# Patient Record
Sex: Female | Born: 1955 | Race: White | Hispanic: No | Marital: Single | Smoking: Former smoker
Health system: Southern US, Community
[De-identification: ages and names within clinical notes are randomized; demographics above are authoritative.]

## PROBLEM LIST (undated history)

## (undated) DIAGNOSIS — R7989 Other specified abnormal findings of blood chemistry: Secondary | ICD-10-CM

## (undated) DIAGNOSIS — E785 Hyperlipidemia, unspecified: Secondary | ICD-10-CM

## (undated) DIAGNOSIS — K8020 Calculus of gallbladder without cholecystitis without obstruction: Secondary | ICD-10-CM

## (undated) DIAGNOSIS — F411 Generalized anxiety disorder: Secondary | ICD-10-CM

## (undated) DIAGNOSIS — M21621 Bunionette of right foot: Secondary | ICD-10-CM

## (undated) DIAGNOSIS — J449 Chronic obstructive pulmonary disease, unspecified: Secondary | ICD-10-CM

## (undated) DIAGNOSIS — R9389 Abnormal findings on diagnostic imaging of other specified body structures: Secondary | ICD-10-CM

## (undated) HISTORY — PX: SINUS SURGERY WITH INSTATRAK: SHX5215

## (undated) HISTORY — DX: Other specified abnormal findings of blood chemistry: R79.89

## (undated) HISTORY — DX: Abnormal findings on diagnostic imaging of other specified body structures: R93.89

## (undated) HISTORY — DX: Calculus of gallbladder without cholecystitis without obstruction: K80.20

## (undated) HISTORY — DX: Hyperlipidemia, unspecified: E78.5

## (undated) HISTORY — PX: TONSILLECTOMY: SUR1361

## (undated) HISTORY — PX: ABDOMINAL HYSTERECTOMY: SHX81

## (undated) HISTORY — DX: Bunionette of right foot: M21.621

## (undated) HISTORY — DX: Generalized anxiety disorder: F41.1

---

## 2006-10-01 ENCOUNTER — Emergency Department (HOSPITAL_COMMUNITY): Admission: EM | Admit: 2006-10-01 | Discharge: 2006-10-01 | Payer: Self-pay | Admitting: Emergency Medicine

## 2017-02-01 ENCOUNTER — Emergency Department (HOSPITAL_BASED_OUTPATIENT_CLINIC_OR_DEPARTMENT_OTHER)
Admission: EM | Admit: 2017-02-01 | Discharge: 2017-02-01 | Disposition: A | Discharge status: Home / Self Care | Payer: BLUE CROSS/BLUE SHIELD | Attending: Emergency Medicine | Admitting: Emergency Medicine

## 2017-02-01 ENCOUNTER — Emergency Department (HOSPITAL_BASED_OUTPATIENT_CLINIC_OR_DEPARTMENT_OTHER): Payer: BLUE CROSS/BLUE SHIELD

## 2017-02-01 ENCOUNTER — Encounter (HOSPITAL_BASED_OUTPATIENT_CLINIC_OR_DEPARTMENT_OTHER): Payer: Self-pay | Admitting: *Deleted

## 2017-02-01 DIAGNOSIS — Z79899 Other long term (current) drug therapy: Secondary | ICD-10-CM | POA: Diagnosis not present

## 2017-02-01 DIAGNOSIS — F17210 Nicotine dependence, cigarettes, uncomplicated: Secondary | ICD-10-CM | POA: Diagnosis not present

## 2017-02-01 DIAGNOSIS — J449 Chronic obstructive pulmonary disease, unspecified: Secondary | ICD-10-CM | POA: Insufficient documentation

## 2017-02-01 DIAGNOSIS — M7989 Other specified soft tissue disorders: Secondary | ICD-10-CM | POA: Diagnosis present

## 2017-02-01 DIAGNOSIS — Z7982 Long term (current) use of aspirin: Secondary | ICD-10-CM | POA: Insufficient documentation

## 2017-02-01 DIAGNOSIS — L03115 Cellulitis of right lower limb: Secondary | ICD-10-CM | POA: Insufficient documentation

## 2017-02-01 HISTORY — DX: Chronic obstructive pulmonary disease, unspecified: J44.9

## 2017-02-01 MED ORDER — CEPHALEXIN 500 MG PO CAPS: 500.0000 mg | ORAL_CAPSULE | Freq: Four times a day (QID) | ORAL | 0 refills | Status: DC

## 2017-02-01 NOTE — ED Notes (Signed)
Patient transported to X-ray 

## 2017-02-01 NOTE — ED Triage Notes (Signed)
Patient states she was at the beach for the last week and four days ago she was playing with her granddaughter who accidentally jumped onto her right foot.  Now has redness, pain and swelling on the lateral right foot.  States the swelling yesterday was worse, and is better today.  Also is itchy.

## 2017-02-01 NOTE — ED Provider Notes (Signed)
[facility] Provider Note   CSN: [id number] Arrival date & time: 02/01/17  0930  By signing my name below, I, Sandra Anthony, attest that this documentation has been prepared under the direction and in the presence of [name], MD . Electronically Signed: Freida Anthony, Scribe. 02/01/2017. 10:28 AM. History   Chief Complaint Chief Complaint  Patient presents with  . Foot Pain    right    The history is provided by the patient. No language interpreter was used.    HPI Comments:  Sandra Anthony is a 61 y.o. female who presents to the Emergency Department complaining of moderate pain to the right foot x 5 days. She states her granddaughter accidentally jumped on her foot. She reports associated redness and swelling to the site. Pt finished 10 day course of antibiotics 7 days ago for an infection following corn removal to the right foot ~ 2 weeks ago.  Pt was re-evaluated by her PCP this AM and was sent to the ED for imagining of the foot. Pt denies CP, and SOB. No alleviating factors noted.    Past Medical History:  Diagnosis Date  . COPD (chronic obstructive pulmonary disease) (HCC)     There are no active problems to display for this patient.   Past Surgical History:  Procedure Laterality Date  . ABDOMINAL HYSTERECTOMY    . TONSILLECTOMY      OB History    No data available       Home Medications    Prior to Admission medications   Medication Sig Start Date End Date Taking? Authorizing Provider  albuterol (PROVENTIL HFA;VENTOLIN HFA) 108 (90 Base) MCG/ACT inhaler Inhale into the lungs every 6 (six) hours as needed for wheezing or shortness of breath.   Yes Historical Provider, MD  Ascorbic Acid (VITAMIN C) 100 MG tablet Take 100 mg by mouth daily.   Yes Historical Provider, MD  aspirin EC 81 MG tablet Take 81 mg by mouth daily.   Yes Historical Provider, MD  B Complex-C (B-COMPLEX WITH VITAMIN C) tablet Take 1 tablet by mouth daily.   Yes Historical  Provider, MD  budesonide-formoterol (SYMBICORT) 80-4.5 MCG/ACT inhaler Inhale 2 puffs into the lungs 2 (two) times daily.   Yes Historical Provider, MD  Multiple Vitamin (MULTIVITAMIN) capsule Take 1 capsule by mouth daily.   Yes Historical Provider, MD    Family History No family history on file.  Social History Social History  Substance Use Topics  . Smoking status: Current Every Day Smoker    Packs/day: 1.00    Types: Cigarettes  . Smokeless tobacco: Never Used  . Alcohol use No     Allergies   Other and Codeine   Review of Systems Review of Systems  Constitutional: Negative for chills and fever.  Respiratory: Negative for shortness of breath.   Cardiovascular: Negative for chest pain.  Musculoskeletal: Positive for arthralgias and myalgias.  Skin: Positive for color change.     Physical Exam Updated Vital Signs BP (!) 143/84 (BP Location: Right Arm)   Pulse 88   Temp 98 F (36.7 C) (Oral)   Resp 16   Ht  (1.549 m)   Wt 119 lb (54 kg)   SpO2 99%   BMI 22.48 kg/m   Physical Exam  Constitutional: She is oriented to person, place, and time. She appears well-developed and well-nourished. No distress.  HENT:  Head: Normocephalic and atraumatic.  Eyes: Conjunctivae are normal.  Cardiovascular: Normal rate.  Pulmonary/Chest: Effort normal.  Abdominal: She exhibits no distension.  Musculoskeletal: She exhibits edema.  Mild swelling of right foot and ankle  No calf tenderness NVI   Neurological: She is alert and oriented to person, place, and time.  Skin: Skin is warm and dry. There is erythema.  Erythema and petechial rash to the lateral right foot and ankle which extends into the distal shin   Psychiatric: She has a normal mood and affect.  Nursing note and vitals reviewed.    ED Treatments / Results  DIAGNOSTIC STUDIES:  Oxygen Saturation is 99% on RA, normal by my interpretation.    COORDINATION OF CARE:  10:26 AM Discussed treatment plan  with pt at bedside and pt agreed to plan.  Labs (all labs ordered are listed, but only abnormal results are displayed) Labs Reviewed - No data to display  EKG  EKG Interpretation None       Radiology Dg Ankle Complete Right  Result Date: 02/01/2017 CLINICAL DATA:  Right lateral ankle pain with redness. EXAM: RIGHT ANKLE - COMPLETE 3+ VIEW COMPARISON:  None. FINDINGS: There is no evidence of fracture, dislocation, or joint effusion. Ossicle of the medial malleolus. No degenerative joint narrowing. IMPRESSION: Negative. Electronically Signed   By: Marnee Spring M.D.   On: 02/01/2017 10:29    Procedures Procedures (including critical care time)  Medications Ordered in ED Medications - No data to display  11:08 AM Pt updated with results.   Initial Impression / Assessment and Plan / ED Course  I have reviewed the triage vital signs and the nursing notes.  Pertinent labs & imaging results that were available during my care of the patient were reviewed by me and considered in my medical decision making (see chart for details).     60yF with R foot pain. She is point tender where she was recently struck by her granddaughter. Imaging negative. Likely contusion.   She has some swelling and a faint petechial rash extending up from this. She is complaining of itching. Could be some type of dermatitis? Vasculitis? Cellulitis is consideration. Did recently have corn removed, but this site appears to be healing swell and skin changes are well proximal.  I think DVT is significantly less likely. Did drive to beach, but [facility] is only a few hours. No calf tenderness. Denies dyspnea or CP. No hx of DVT. Not hypoxic. HR normal.   Final Clinical Impressions(s) / ED Diagnoses   Final diagnoses:  Cellulitis of right lower leg    New Prescriptions New Prescriptions   No medications on file   I personally preformed the services scribed in my presence. The recorded information has been  reviewed is accurate. [name], MD.     [name], MD 02/09/17 [phone]

## 2018-08-28 IMAGING — US US EXTREM LOW VENOUS*R*
1 series · 13 of 24 positions shown · non-contrast
Comparison: None.

CLINICAL DATA: Right foot pain radiating to calf, with
redness/streaking



[Series 1: us extrem low venous*right* · 0.05mm/px · 13 of 35 slices shown]
[im 1/35]
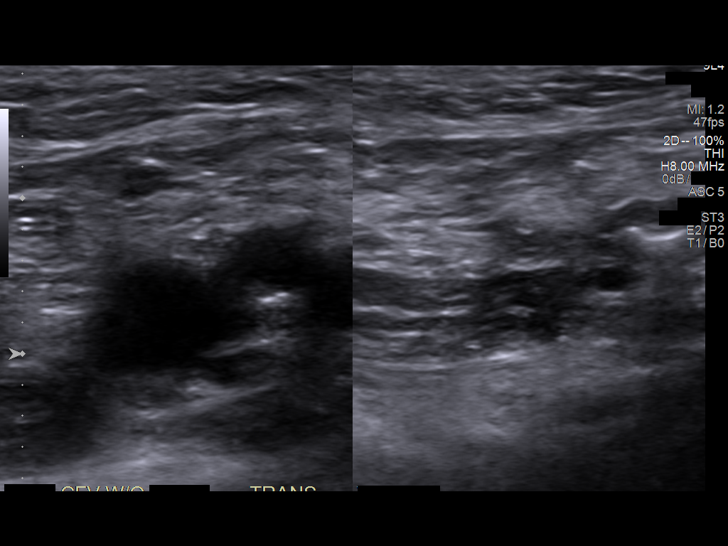
[im 3/35]
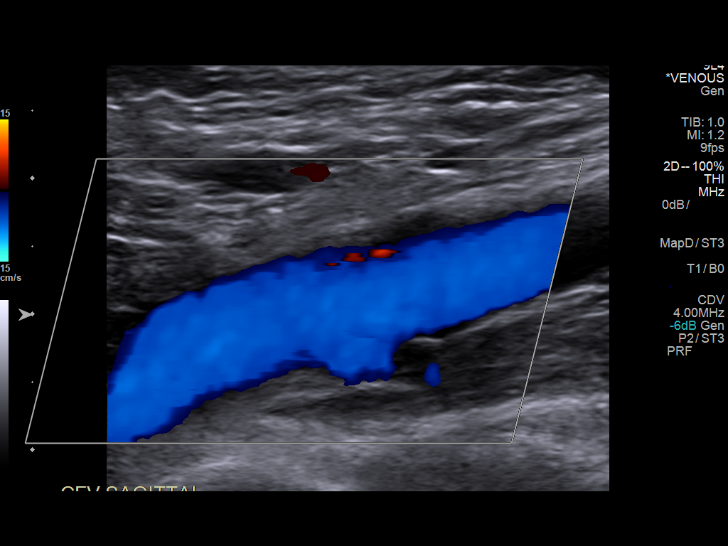
[im 6/35]
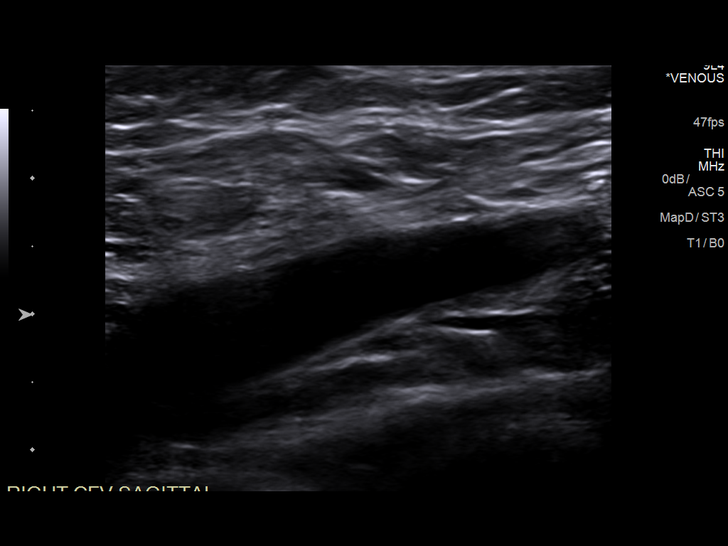
[im 9/35]
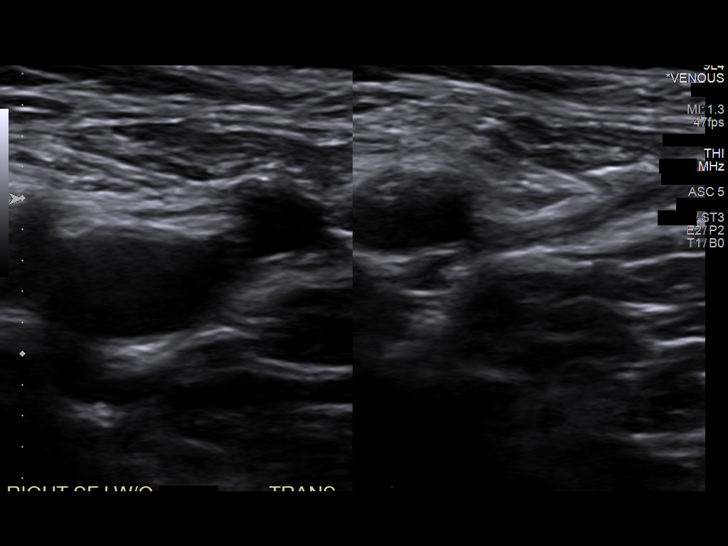
[im 12/35]
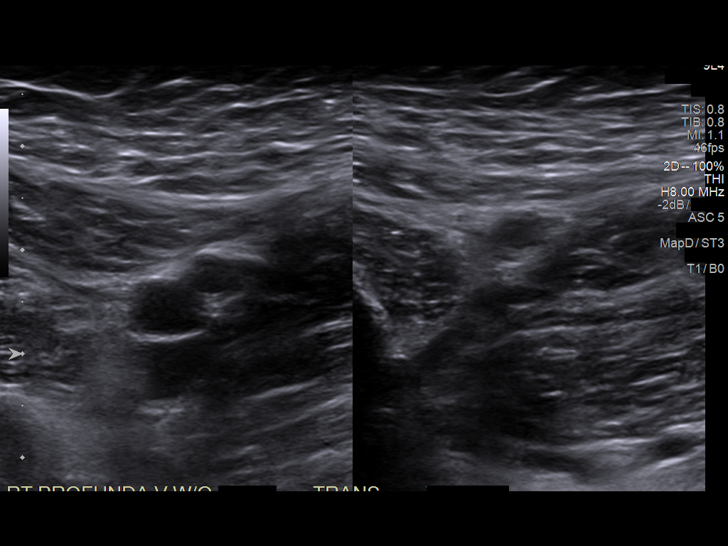
[im 15/35]
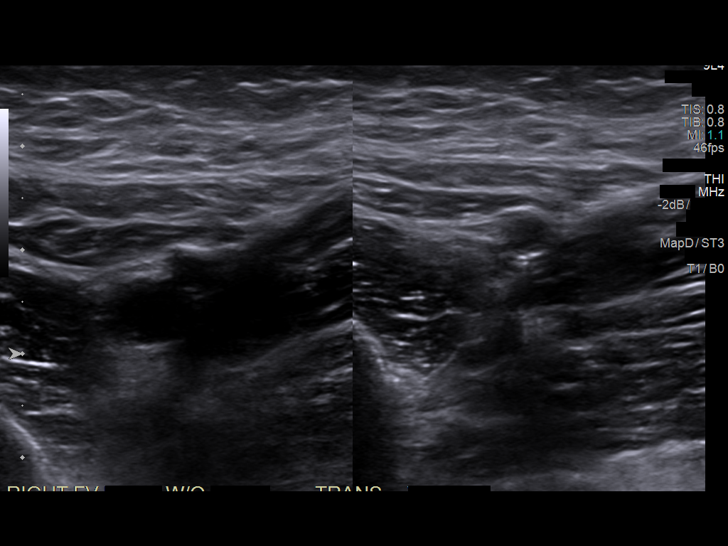
[im 18/35]
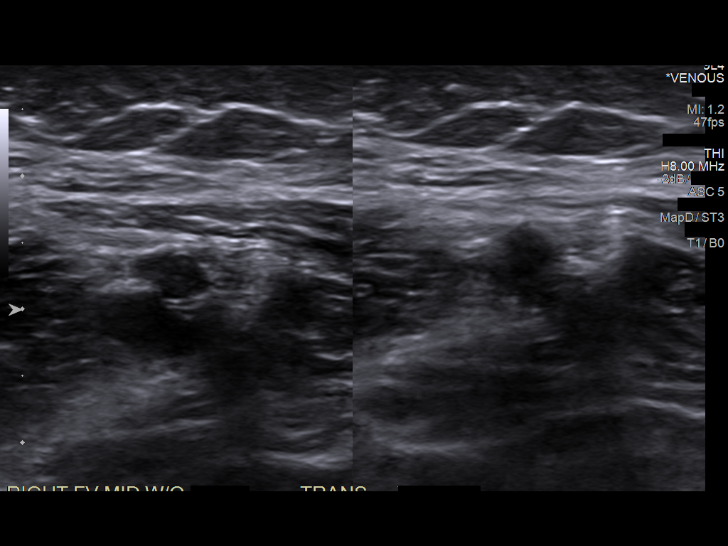
[im 20/35]
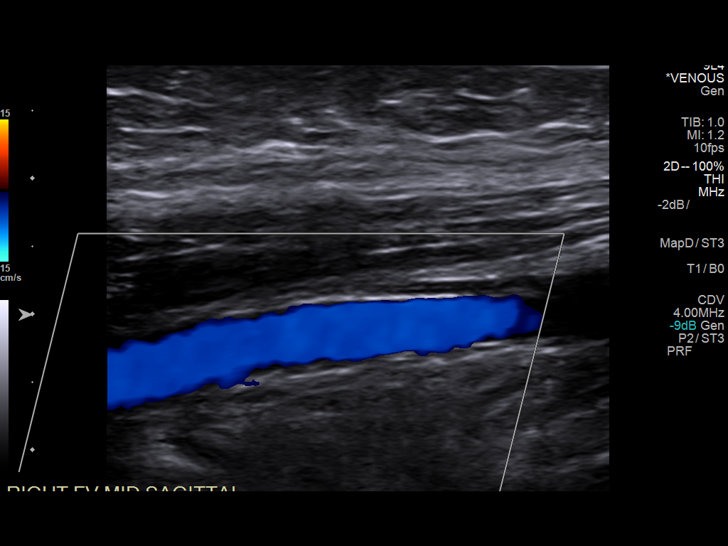
[im 23/35]
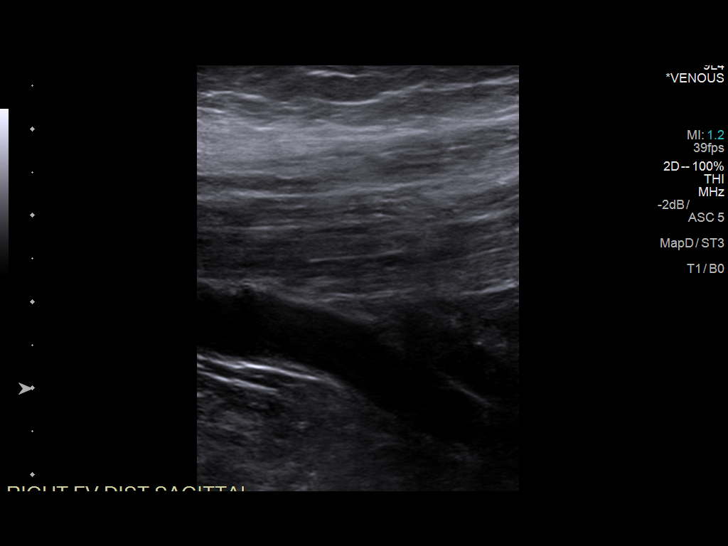
[im 26/35]
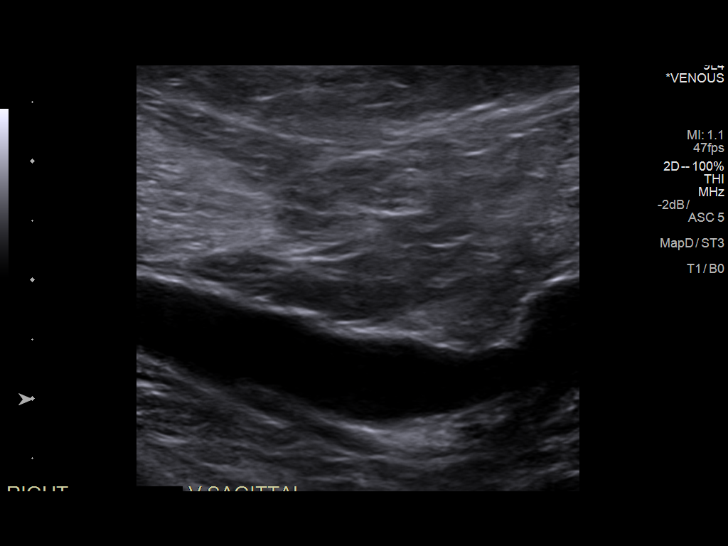
[im 29/35]
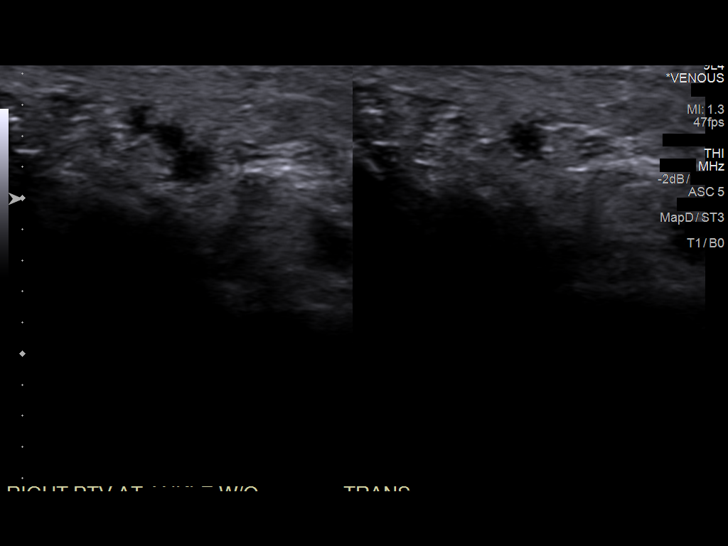
[im 32/35]
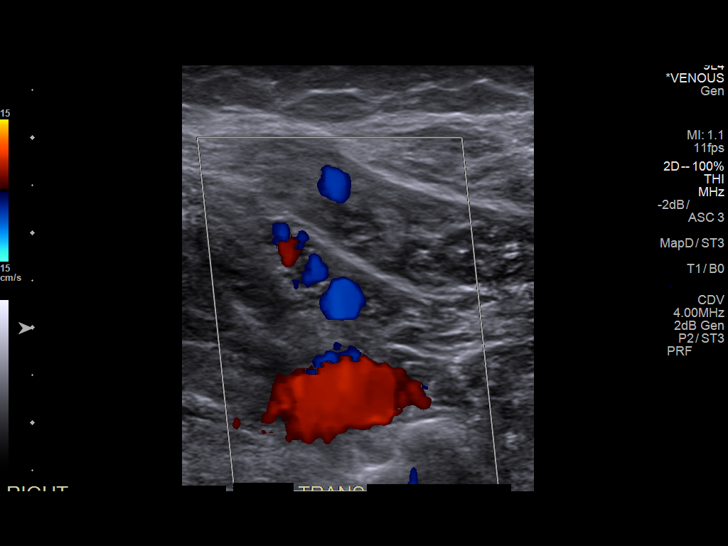
[im 35/35]
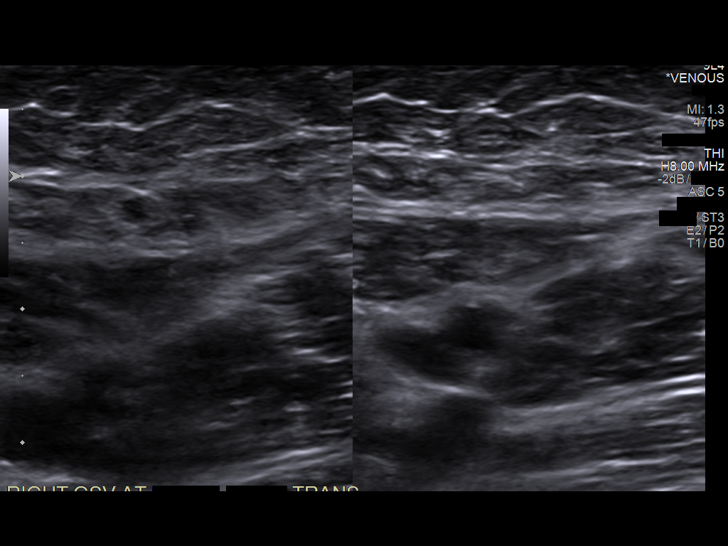

[13 of 24 positions shown; findings below may reference images not displayed]

FINDINGS: Contralateral Common Femoral Vein: Respiratory phasicity is normal
and symmetric with the symptomatic side. No evidence of thrombus.
Normal compressibility.

Common Femoral Vein: No evidence of thrombus. Normal
compressibility, respiratory phasicity and response to augmentation.

Saphenofemoral Junction: No evidence of thrombus. Normal
compressibility and flow on color Doppler imaging.

Profunda Femoral Vein: No evidence of thrombus. Normal
compressibility and flow on color Doppler imaging.

Femoral Vein: No evidence of thrombus. Normal compressibility,
respiratory phasicity and response to augmentation.

Popliteal Vein: No evidence of thrombus. Normal compressibility,
respiratory phasicity and response to augmentation.

Calf Veins: No evidence of thrombus. Normal compressibility and flow
on color Doppler imaging.

Superficial Great Saphenous Vein: No evidence of thrombus. Normal
compressibility and flow on color Doppler imaging.

Venous Reflux:  None.

Other Findings:  None.
IMPRESSION: No evidence of deep venous thrombosis.

## 2019-03-07 ENCOUNTER — Ambulatory Visit: Payer: Self-pay | Admitting: Cardiology

## 2019-03-08 ENCOUNTER — Ambulatory Visit: Payer: Self-pay | Admitting: Cardiology

## 2019-03-11 ENCOUNTER — Ambulatory Visit: Payer: Self-pay | Admitting: Cardiology

## 2019-03-11 DIAGNOSIS — K8020 Calculus of gallbladder without cholecystitis without obstruction: Secondary | ICD-10-CM

## 2019-03-11 DIAGNOSIS — R7989 Other specified abnormal findings of blood chemistry: Secondary | ICD-10-CM

## 2019-03-11 DIAGNOSIS — M21621 Bunionette of right foot: Secondary | ICD-10-CM

## 2019-03-11 DIAGNOSIS — R9389 Abnormal findings on diagnostic imaging of other specified body structures: Secondary | ICD-10-CM

## 2019-03-11 DIAGNOSIS — F411 Generalized anxiety disorder: Secondary | ICD-10-CM

## 2019-03-14 ENCOUNTER — Other Ambulatory Visit: Payer: Self-pay

## 2019-03-14 ENCOUNTER — Encounter: Payer: Self-pay | Admitting: Cardiology

## 2019-03-14 ENCOUNTER — Ambulatory Visit: Payer: Medicare HMO | Admitting: Cardiology

## 2019-03-14 VITALS — BP 155/69 | HR 72 | Ht 61.0 in | Wt 128.7 lb

## 2019-03-14 DIAGNOSIS — R030 Elevated blood-pressure reading, without diagnosis of hypertension: Secondary | ICD-10-CM

## 2019-03-14 DIAGNOSIS — Z87891 Personal history of nicotine dependence: Secondary | ICD-10-CM

## 2019-03-14 DIAGNOSIS — R0989 Other specified symptoms and signs involving the circulatory and respiratory systems: Secondary | ICD-10-CM

## 2019-03-14 DIAGNOSIS — R011 Cardiac murmur, unspecified: Secondary | ICD-10-CM

## 2019-03-14 NOTE — Progress Notes (Signed)
Primary Physician:  Vicenta Aly, FNP   Patient ID: [name], female    DOB: 1956/08/16, 63 y.o.   MRN: [id number]  Subjective:    Chief Complaint  Patient presents with  . Heart Murmur  . New Patient (Initial Visit)    HPI: Sandra Anthony  is a 63 y.o. female  with former tobacco use, COPD, referred to [place] by Vicenta Aly, NP for evaluation of newly noted heart murmur.  Patient was recently seen for routine follow up and newly noted heart murmur was noted. She is asymptomatic in regards to this. She denies any history of hypertension or hyperlipidemia. States TSH has been elevated in the past, and has unyielding workup by endocrinology. She denies any chest pain, shortness of breath, palpitations, leg swelling, syncope, or symptoms suggestive of TIA. She does have some cramping in bilateral legs at the end of the day after working all day. She denies any cramping or fatigue with walking. Notices that her symptoms improve if she drinks some gatorade during the day.   She works as a Educational psychologist and states that she often walks approximately 10 miles per day with her job. She is currently is out of work due to [employer].   Reports that she quit smoking in Nov 2018. She denies any drug or alcohol use.   Past Medical History:  Diagnosis Date  . Abnormal chest x-ray   . Abnormal TSH   . COPD (chronic obstructive pulmonary disease) ([place])   . GAD (generalized anxiety disorder)   . Gallstones   . Hyperlipidemia   . Tailor's bunion of right foot     Past Surgical History:  Procedure Laterality Date  . ABDOMINAL HYSTERECTOMY    . SINUS SURGERY WITH INSTATRAK    . TONSILLECTOMY      Social History   Socioeconomic History  . Marital status: Single    Spouse name: Not on file  . Number of children: 3  . Years of education: Not on file  . Highest education level: Not on file  Occupational History  . Not on file  Social Needs  . Financial resource strain: Not on file  .  Food insecurity:    Worry: Not on file    Inability: Not on file  . Transportation needs:    Medical: Not on file    Non-medical: Not on file  Tobacco Use  . Smoking status: Former Smoker    Packs/day: 1.00    Types: Cigarettes    Last attempt to quit: 08/27/2017    Years since quitting: 1.5  . Smokeless tobacco: Never Used  Substance and Sexual Activity  . Alcohol use: No  . Drug use: Never  . Sexual activity: Not on file  Lifestyle  . Physical activity:    Days per week: Not on file    Minutes per session: Not on file  . Stress: Not on file  Relationships  . Social connections:    Talks on phone: Not on file    Gets together: Not on file    Attends religious service: Not on file    Active member of club or organization: Not on file    Attends meetings of clubs or organizations: Not on file    Relationship status: Not on file  . Intimate partner violence:    Fear of current or ex partner: Not on file    Emotionally abused: Not on file    Physically abused: Not on file  Forced sexual activity: Not on file  Other Topics Concern  . Not on file  Social History Narrative  . Not on file    Review of Systems  Constitution: Negative for decreased appetite, malaise/fatigue, weight gain and weight loss.  Eyes: Negative for visual disturbance.  Cardiovascular: Negative for chest pain, claudication, dyspnea on exertion, leg swelling, orthopnea, palpitations and syncope.  Respiratory: Negative for hemoptysis and wheezing.   Endocrine: Negative for cold intolerance and heat intolerance.  Hematologic/Lymphatic: Does not bruise/bleed easily.  Skin: Negative for nail changes.  Musculoskeletal: Negative for muscle weakness and myalgias.  Gastrointestinal: Negative for abdominal pain, change in bowel habit, nausea and vomiting.  Neurological: Negative for difficulty with concentration, dizziness, focal weakness and headaches.  Psychiatric/Behavioral: Negative for altered mental  status and suicidal ideas.  All other systems reviewed and are negative.     Objective:  Blood pressure (!) 155/69, pulse 72, height 5' 1"  (1.549 m), weight 128 lb 11.2 oz (58.4 kg), SpO2 99 %. Body mass index is 24.32 kg/m.    Physical Exam  Constitutional: She is oriented to person, place, and time. Vital signs are normal. She appears well-developed and well-nourished.  HENT:  Head: Normocephalic and atraumatic.  Neck: Normal range of motion.  Cardiovascular: Normal rate, regular rhythm and intact distal pulses.  Murmur heard.  Early systolic murmur is present with a grade of 2/6 at the upper right sternal border radiating to the neck and axilla. Pulses:      Carotid pulses are on the right side with bruit and on the left side with bruit.      Femoral pulses are 1+ on the right side and 1+ on the left side.      Popliteal pulses are 1+ on the right side and 1+ on the left side.       Dorsalis pedis pulses are 1+ on the right side and 1+ on the left side.       Posterior tibial pulses are 1+ on the right side and 1+ on the left side.  Pulmonary/Chest: Effort normal and breath sounds normal. No accessory muscle usage. No respiratory distress.  Abdominal: Soft. Bowel sounds are normal.  Musculoskeletal: Normal range of motion.  Neurological: She is alert and oriented to person, place, and time.  Skin: Skin is warm and dry.  Vitals reviewed.  Radiology: No results found.  Laboratory examination:   03/01/2019: Creatinine 0.65, eGFR 94, K 5.3, CMP otherwise normal. Cholesterol 231, triglycerides 54, HDL 98, LDL 122.   No flowsheet data found. No flowsheet data found. Lipid Panel  No results found for: CHOL, TRIG, HDL, CHOLHDL, VLDL, LDLCALC, LDLDIRECT HEMOGLOBIN A1C No results found for: HGBA1C, MPG TSH No results for input(s): TSH in the last 8760 hours.  PRN Meds:. Medications Discontinued During This Encounter  Medication Reason  . cephALEXin (KEFLEX) 500 MG capsule  Error   Current Meds  Medication Sig  . Acetaminophen 500 MG coapsule Take by mouth every 4 (four) hours as needed.   Marland Kitchen albuterol (PROVENTIL HFA;VENTOLIN HFA) 108 (90 Base) MCG/ACT inhaler Inhale into the lungs every 6 (six) hours as needed for wheezing or shortness of breath.  . Ascorbic Acid (VITAMIN C) 100 MG tablet Take 100 mg by mouth daily.  Marland Kitchen aspirin EC 81 MG tablet Take 81 mg by mouth daily.  . B Complex-C (B-COMPLEX WITH VITAMIN C) tablet Take 1 tablet by mouth daily.  . budesonide-formoterol (SYMBICORT) 80-4.5 MCG/ACT inhaler Inhale 2 puffs into the lungs  2 (two) times daily.  . Calcium Carb-Cholecalciferol 600-100 MG-UNIT CAPS Take by mouth.  Marland Kitchen FLUoxetine (PROZAC) 10 MG capsule Take 10 mg by mouth daily.  Marland Kitchen ibuprofen (ADVIL) 200 MG tablet Take 200 mg by mouth every 6 (six) hours as needed.  . loratadine (CLARITIN) 10 MG tablet Take 10 mg by mouth daily.  . Multiple Vitamin (MULTIVITAMIN) capsule Take 1 capsule by mouth daily.  . pseudoephedrine-acetaminophen (TYLENOL SINUS) 30-500 MG TABS tablet Take 1 tablet by mouth every 4 (four) hours as needed.    Cardiac Studies:     Assessment:   Systolic murmur - Plan: EKG 12-Lead, PCV ECHOCARDIOGRAM COMPLETE  Bilateral carotid bruits - Plan: PCV CAROTID DUPLEX (BILATERAL)  Former tobacco use  Elevated blood pressure reading in office without diagnosis of hypertension  EKG 03/14/2019: Normal sinus rhythm at 69 bpm, normal axis, IRBBB. Low voltage complexes.   Recommendations:   Patient with former tobacco use, COPD, referred to [place] for evaluation of newly noted systolic murmur.  Patient denies ever being told in the past to have heart murmur.  Noted to have grade 2 out of 6 systolic aortic murmur.  Fortunately is asymptomatic.  Will obtain echocardiogram for further evaluation.  She is also noted to have bilateral carotid bruits, will obtain carotid duplex for further evaluation as well.  Blood pressure is elevated in our office,  but generally is stable.  Will reevaluate at her next office visit.  Has incomplete right bundle branch block on EKG today, I do not have previous EKGs for comparison.  No symptoms of chest pain or shortness of breath.  Do not feel she needs stress testing at this point.  Patient has mildly abnormal vascular exam, does not appear to have any significant claudication symptoms.  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  We will further evaluate and discuss at her next office visit.  Encouraged her to remain abstinent from tobacco use to further decrease her risk.  I will see her back after the test for further recommendations and reevaluation.   *I have discussed this case with Dr. Woody Seller and he personally examined the patient and participated in formulating the plan.*   Miquel Dunn, MSN, APRN, FNP-C [facility]. [place] Office: [phone] Fax: [phone]

## 2019-04-08 ENCOUNTER — Ambulatory Visit (INDEPENDENT_AMBULATORY_CARE_PROVIDER_SITE_OTHER): Payer: Medicare HMO

## 2019-04-08 ENCOUNTER — Other Ambulatory Visit: Payer: Self-pay

## 2019-04-08 DIAGNOSIS — R0989 Other specified symptoms and signs involving the circulatory and respiratory systems: Secondary | ICD-10-CM | POA: Diagnosis not present

## 2019-04-08 DIAGNOSIS — R011 Cardiac murmur, unspecified: Secondary | ICD-10-CM

## 2019-04-14 ENCOUNTER — Other Ambulatory Visit: Payer: Self-pay

## 2019-04-14 ENCOUNTER — Ambulatory Visit (INDEPENDENT_AMBULATORY_CARE_PROVIDER_SITE_OTHER): Payer: Medicare HMO | Admitting: Cardiology

## 2019-04-14 ENCOUNTER — Encounter: Payer: Self-pay | Admitting: Cardiology

## 2019-04-14 DIAGNOSIS — I6523 Occlusion and stenosis of bilateral carotid arteries: Secondary | ICD-10-CM

## 2019-04-14 DIAGNOSIS — I350 Nonrheumatic aortic (valve) stenosis: Secondary | ICD-10-CM | POA: Diagnosis not present

## 2019-04-14 DIAGNOSIS — Z87891 Personal history of nicotine dependence: Secondary | ICD-10-CM

## 2019-04-14 MED ORDER — ROSUVASTATIN CALCIUM 10 MG PO TABS: 10.0000 mg | ORAL_TABLET | Freq: Every day | ORAL | 3 refills | Status: DC

## 2019-04-14 NOTE — Progress Notes (Signed)
Primary Physician:  Vicenta Aly, FNP   Patient ID: Sandra Anthony, female    DOB: 1956/01/24, 63 y.o.   MRN: [id number]  Subjective:    Chief Complaint  Patient presents with  . Follow-up    echo, carotid results  . Hypertension   This visit type was conducted due to national recommendations for restrictions regarding the COVID-19 Pandemic (e.g. social distancing).  This format is felt to be most appropriate for this patient at this time.  All issues noted in this document were discussed and addressed.  No physical exam was performed (except for noted visual exam findings with Telehealth visits).  The patient has consented to conduct a Telehealth visit and understands insurance will be billed.   I discussed the limitations of evaluation and management by telemedicine and the availability of in person appointments. The patient expressed understanding and agreed to proceed.  Virtual Visit via Video Note is as below  I connected with@, on 04/14/19 at 1045 by a video enabled telemedicine application and verified that I am speaking with the correct person using two identifiers.     I have discussed with her regarding the safety during COVID Pandemic and steps and precautions including social distancing with the patient.    HPI: Sandra Anthony  is a 63 y.o. female  with former tobacco use, COPD, recently evaluated by [place] for newly noted murmur.   Patient underwent echocardiogram and is here to discuss results. She is essentially asymptomatic. No shortness of breath or dyspnea. Blood pressure was elevated at her last office visit, but is without history of hypertension.  She does have some cramping in bilateral legs at the end of the day after working all day. She denies any cramping or fatigue with walking. Notices that her symptoms improve if she drinks some gatorade during the day.   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 She is currently is out of work due to [employer].   Reports that she quit smoking in Nov 2018. She denies any drug or alcohol use.   Past Medical History:  Diagnosis Date  . Abnormal chest x-ray   . Abnormal TSH   . COPD (chronic obstructive pulmonary disease) ([place])   . GAD (generalized anxiety disorder)   . Gallstones   . Hyperlipidemia   . Tailor's bunion of right foot     Past Surgical History:  Procedure Laterality Date  . ABDOMINAL HYSTERECTOMY    . SINUS SURGERY WITH INSTATRAK    . TONSILLECTOMY      Social History   Socioeconomic History  . Marital status: Single    Spouse name: Not on file  . Number of children: 3  . Years of education: Not on file  . Highest education level: Not on file  Occupational History  . Not on file  Social Needs  . Financial resource strain: Not on file  . Food insecurity    Worry: Not on file    Inability: Not on file  . Transportation needs    Medical: Not on file    Non-medical: Not on file  Tobacco Use  . Smoking status: Former Smoker    Packs/day: 1.00    Types: Cigarettes    Quit date: 08/27/2017    Years since quitting: 1.6  . Smokeless tobacco: Never Used  Substance and Sexual Activity  . Alcohol use: No  . Drug use: Never  .  Sexual activity: Not on file  Lifestyle  . Physical activity    Days per week: Not on file    Minutes per session: Not on file  . Stress: Not on file  Relationships  . Social Herbalist on phone: Not on file    Gets together: Not on file    Attends religious service: Not on file    Active member of club or organization: Not on file    Attends meetings of clubs or organizations: Not on file    Relationship status: Not on file  . Intimate partner violence    Fear of current or ex partner: Not on file    Emotionally abused: Not on file    Physically abused: Not on file    Forced sexual activity: Not on file  Other Topics Concern  . Not on file  Social History Narrative  . Not  on file    Review of Systems  Constitution: Negative for decreased appetite, malaise/fatigue, weight gain and weight loss.  Eyes: Negative for visual disturbance.  Cardiovascular: Negative for chest pain, claudication, dyspnea on exertion, leg swelling, orthopnea, palpitations and syncope.  Respiratory: Negative for hemoptysis and wheezing.   Endocrine: Negative for cold intolerance and heat intolerance.  Hematologic/Lymphatic: Does not bruise/bleed easily.  Skin: Negative for nail changes.  Musculoskeletal: Negative for muscle weakness and myalgias.  Gastrointestinal: Negative for abdominal pain, change in bowel habit, nausea and vomiting.  Neurological: Negative for difficulty with concentration, dizziness, focal weakness and headaches.  Psychiatric/Behavioral: Negative for altered mental status and suicidal ideas.  All other systems reviewed and are negative.     Objective:  There were no vitals taken for this visit. There is no height or weight on file to calculate BMI.    Physical Exam  Constitutional: She is oriented to person, place, and time. Vital signs are normal. She appears well-developed and well-nourished.  HENT:  Head: Normocephalic and atraumatic.  Neck: Normal range of motion.  Cardiovascular: Normal rate, regular rhythm and intact distal pulses.  Murmur heard.  Early systolic murmur is present with a grade of 2/6 at the upper right sternal border radiating to the neck and axilla. Pulses:      Carotid pulses are on the right side with bruit and on the left side with bruit.      Femoral pulses are 1+ on the right side and 1+ on the left side.      Popliteal pulses are 1+ on the right side and 1+ on the left side.       Dorsalis pedis pulses are 1+ on the right side and 1+ on the left side.       Posterior tibial pulses are 1+ on the right side and 1+ on the left side.  Pulmonary/Chest: Effort normal and breath sounds normal. No accessory muscle usage. No respiratory  distress.  Abdominal: Soft. Bowel sounds are normal.  Musculoskeletal: Normal range of motion.  Neurological: She is alert and oriented to person, place, and time.  Skin: Skin is warm and dry.  Vitals reviewed.  Radiology: No results found.  Laboratory examination:   03/01/2019: Creatinine 0.65, eGFR 94, K 5.3, CMP otherwise normal. Cholesterol 231, triglycerides 54, HDL 98, LDL 122.   No flowsheet data found. No flowsheet data found. Lipid Panel  No results found for: CHOL, TRIG, HDL, CHOLHDL, VLDL, LDLCALC, LDLDIRECT HEMOGLOBIN A1C No results found for: HGBA1C, MPG TSH No results for input(s): TSH in the last  8760 hours.  PRN Meds:. There are no discontinued medications. Current Meds  Medication Sig  . Acetaminophen 500 MG coapsule Take by mouth every 4 (four) hours as needed.   Marland Kitchen albuterol (PROVENTIL HFA;VENTOLIN HFA) 108 (90 Base) MCG/ACT inhaler Inhale into the lungs every 6 (six) hours as needed for wheezing or shortness of breath.  . Ascorbic Acid (VITAMIN C) 100 MG tablet Take 100 mg by mouth daily.  Marland Kitchen aspirin EC 81 MG tablet Take 81 mg by mouth daily.  . B Complex-C (B-COMPLEX WITH VITAMIN C) tablet Take 1 tablet by mouth daily.  . budesonide-formoterol (SYMBICORT) 80-4.5 MCG/ACT inhaler Inhale 2 puffs into the lungs 2 (two) times daily.  . Calcium Carb-Cholecalciferol 600-100 MG-UNIT CAPS Take by mouth.  Marland Kitchen FLUoxetine (PROZAC) 10 MG capsule Take 10 mg by mouth daily.  Marland Kitchen ibuprofen (ADVIL) 200 MG tablet Take 200 mg by mouth every 6 (six) hours as needed.  . loratadine (CLARITIN) 10 MG tablet Take 10 mg by mouth daily.  . Multiple Vitamin (MULTIVITAMIN) capsule Take 1 capsule by mouth daily.  . pseudoephedrine-acetaminophen (TYLENOL SINUS) 30-500 MG TABS tablet Take 1 tablet by mouth every 4 (four) hours as needed.    Cardiac Studies:   Echocardiogram 04/08/2019: Normal LV systolic function with EF 56%. Left ventricle cavity is normal in size. Mild concentric  hypertrophy of the left ventricle. Normal global wall motion. Normal diastolic filling pattern. Calculated EF 56%. Left atrial cavity is normal in size. Thin interatrial septum without definite 2D or color Doppler evidence of interatrial shunt. Aortic valve is likely trileaflet with mild aortic valve leaflet calcification. Mild aortic stenosis. Aortic valve mean gradient of 8 mmHg, Vmax of 2.1  m/s. Calculated aortic valve area by continuity equation is 1.3 cm. No aortic valve regurgitation noted. Mild (Grade I) mitral regurgitation. Moderate tricuspid regurgitation. Estimated pulmonary artery systolic pressure 28 mmHg.  Carotid artery duplex  04/08/2019: Stenosis in the right internal carotid artery (50-69%). Stenosis in the left internal carotid artery (1-15%). Mild stenosis in the left external carotid artery (<50%). Bilateral heterogeneous plaque noted. Antegrade right vertebral artery flow. Antegrade left vertebral artery flow. Follow up in six months is appropriate if clinically indicated.  Assessment:     ICD-10-CM   1. Aortic stenosis, mild  I35.0   2. Asymptomatic bilateral carotid artery stenosis  I65.23 Comprehensive Metabolic Panel (CMET)    Lipid Profile    Lipid Profile    Comprehensive Metabolic Panel (CMET)  3. Tobacco use disorder  F17.200      EKG 03/14/2019: Normal sinus rhythm at 69 bpm, normal axis, IRBBB. Low voltage complexes.   Recommendations:   I discussed recently obtained echocardiogram results with the patient, has mild aortic stenosis along with aortic calcification.  She is asymptomatic in regards to this.  Would recommend repeating echocardiogram in 1 to 2 years for surveillance.  Did have mild LVH by echo and blood pressure was elevated at her last office visit.  She denies any history of hypertension and did not have a way to check her blood pressure today.  Will reevaluate at her next office visit and if elevated at that time, will start  antihypertensive therapy.  Patient has bilateral asymptomatic carotid stenosis will need repeat carotid duplex in 6 months.  In view of this and aortic calcification, recommended statin therapy.  Previously lipids were also slightly elevated.  I will start her on Crestor 10 mg daily.  She was previously on aspirin, but had discontinued.  I  have encouraged her to resume taking 81 mg of aspirin.  No history of bleeding problems.  Patient has incomplete right bundle branch block, do not have previous EKGs for comparison.  She is asymptomatic, no chest pain or shortness of breath.  Could consider stress testing for risk stratification, but will hold off for now in view of COVID and lack of symptoms.  I will check lipids and CMP in 3 months and would like to see her back after that to follow-up on lab results, blood pressure, and Crestor.   Miquel Dunn, MSN, APRN, FNP-C [facility]. [place] Office: [phone] Fax: [phone]

## 2019-07-18 ENCOUNTER — Other Ambulatory Visit: Payer: Self-pay | Admitting: Cardiology

## 2019-07-26 ENCOUNTER — Other Ambulatory Visit: Payer: Self-pay

## 2019-07-26 ENCOUNTER — Ambulatory Visit (INDEPENDENT_AMBULATORY_CARE_PROVIDER_SITE_OTHER): Payer: Medicare HMO | Admitting: Cardiology

## 2019-07-26 ENCOUNTER — Encounter: Payer: Self-pay | Admitting: Cardiology

## 2019-07-26 VITALS — BP 175/81 | HR 64 | Temp 97.3°F | Ht 61.0 in | Wt 135.0 lb

## 2019-07-26 DIAGNOSIS — I350 Nonrheumatic aortic (valve) stenosis: Secondary | ICD-10-CM | POA: Diagnosis not present

## 2019-07-26 DIAGNOSIS — E782 Mixed hyperlipidemia: Secondary | ICD-10-CM

## 2019-07-26 DIAGNOSIS — I10 Essential (primary) hypertension: Secondary | ICD-10-CM | POA: Diagnosis not present

## 2019-07-26 DIAGNOSIS — I6523 Occlusion and stenosis of bilateral carotid arteries: Secondary | ICD-10-CM | POA: Diagnosis not present

## 2019-07-26 MED ORDER — AMLODIPINE BESYLATE 5 MG PO TABS: 5.0000 mg | ORAL_TABLET | Freq: Every day | ORAL | 2 refills | Status: DC

## 2019-07-26 NOTE — Progress Notes (Signed)
Primary Physician:  Vicenta Aly, FNP   Patient ID: Sandra Anthony, female    DOB: [birth year], 63 y.o.   MRN: [id number]  Subjective:    Chief Complaint  Patient presents with  . Hypertension  . Hyperlipidemia  . Follow-up    3 month    HPI: Sandra Anthony  is a 63 y.o. female  with former tobacco use, COPD, recently evaluated by [place] for newly noted murmur.   Underwent echocardiogram in June 2020 revealing normal LVEF and mild aortic stenosis.  Carotid duplex also performed at that time revealed right ICA stenosis of 50 to 69%.  She is now on Crestor and aspirin therapy.  She is essentially asymptomatic. No shortness of breath or dyspnea. Blood pressure was elevated at her last office visit, but is without history of hypertension.  She now presents for 26-monthfollow-up.  She does have some cramping in bilateral legs at the end of the day after working all day. She denies any cramping or fatigue with walking. Notices that her symptoms improve if she drinks some gatorade during the day.   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 She is currently is out of work due to [employer]   Reports that she quit smoking in Nov 2018. She denies any drug or alcohol use.   Past Medical History:  Diagnosis Date  . Abnormal chest x-ray   . Abnormal TSH   . [place]   . GAD (generalized anxiety disorder)   . Gallstones   . Hyperlipidemia   . Tailor's bunion of right foot     Past Surgical History:  Procedure Laterality Date  . ABDOMINAL HYSTERECTOMY    . SINUS SURGERY WITH INSTATRAK    . TONSILLECTOMY      Social History   Socioeconomic History  . Marital status: Single    Spouse name: Not on file  . Number of children: 3  . Years of education: Not on file  . Highest education level: Not on file  Occupational History  . Not on file  Social Needs  . Financial resource strain:  Not on file  . Food insecurity    Worry: Not on file    Inability: Not on file  . Transportation needs    Medical: Not on file    Non-medical: Not on file  Tobacco Use  . Smoking status: Former Smoker    Packs/day: 1.00    Years: 40.00    Pack years: 40.00    Types: Cigarettes    Quit date: 08/27/2017    Years since quitting: 1.9  . Smokeless tobacco: Never Used  Substance and Sexual Activity  . Alcohol use: No  . Drug use: Never  . Sexual activity: Not on file  Lifestyle  . Physical activity    Days per week: Not on file    Minutes per session: Not on file  . Stress: Not on file  Relationships  . Social cHerbaliston phone: Not on file    Gets together: Not on file    Attends religious service: Not on file    Active member of club or organization: Not on file    Attends meetings of clubs or organizations: Not on file    Relationship status: Not on file  . Intimate partner violence    Fear of current or ex partner: Not on file  Emotionally abused: Not on file    Physically abused: Not on file    Forced sexual activity: Not on file  Other Topics Concern  . Not on file  Social History Narrative  . Not on file    Review of Systems  Constitution: Negative for decreased appetite, malaise/fatigue, weight gain and weight loss.  Eyes: Negative for visual disturbance.  Cardiovascular: Negative for chest pain, claudication, dyspnea on exertion, leg swelling, orthopnea, palpitations and syncope.  Respiratory: Negative for hemoptysis and wheezing.   Endocrine: Negative for cold intolerance and heat intolerance.  Hematologic/Lymphatic: Does not bruise/bleed easily.  Skin: Negative for nail changes.  Musculoskeletal: Negative for muscle weakness and myalgias.  Gastrointestinal: Negative for abdominal pain, change in bowel habit, nausea and vomiting.  Neurological: Negative for difficulty with concentration, dizziness, focal weakness and headaches.   Psychiatric/Behavioral: Negative for altered mental status and suicidal ideas.  All other systems reviewed and are negative.     Objective:  Blood pressure (!) 175/81, pulse 64, temperature (!) 97.3 F (36.3 C), height 5' 1"  (1.549 m), weight 135 lb (61.2 kg), SpO2 98 %. Body mass index is 25.51 kg/m.    Physical Exam  Constitutional: She is oriented to person, place, and time. Vital signs are normal. She appears well-developed and well-nourished.  HENT:  Head: Normocephalic and atraumatic.  Neck: Normal range of motion.  Cardiovascular: Normal rate, regular rhythm and intact distal pulses.  Murmur heard.  Early systolic murmur is present with a grade of 2/6 at the upper right sternal border radiating to the neck and axilla. Pulses:      Carotid pulses are on the right side with bruit and on the left side with bruit.      Femoral pulses are 1+ on the right side and 1+ on the left side.      Popliteal pulses are 1+ on the right side and 1+ on the left side.       Dorsalis pedis pulses are 1+ on the right side and 1+ on the left side.       Posterior tibial pulses are 1+ on the right side and 1+ on the left side.  Pulmonary/Chest: Effort normal and breath sounds normal. No accessory muscle usage. No respiratory distress.  Abdominal: Soft. Bowel sounds are normal.  Musculoskeletal: Normal range of motion.  Neurological: She is alert and oriented to person, place, and time.  Skin: Skin is warm and dry.  Vitals reviewed.  Radiology: No results found.  Laboratory examination:   03/01/2019: Creatinine 0.65, eGFR 94, K 5.3, CMP otherwise normal. Cholesterol 231, triglycerides 54, HDL 98, LDL 122.   No flowsheet data found. No flowsheet data found. Lipid Panel  No results found for: CHOL, TRIG, HDL, CHOLHDL, VLDL, LDLCALC, LDLDIRECT HEMOGLOBIN A1C No results found for: HGBA1C, MPG TSH No results for input(s): TSH in the last 8760 hours.  PRN Meds:. There are no discontinued  medications. Current Meds  Medication Sig  . Acetaminophen 500 MG coapsule Take by mouth every 4 (four) hours as needed.   Marland Kitchen albuterol (PROVENTIL HFA;VENTOLIN HFA) 108 (90 Base) MCG/ACT inhaler Inhale into the lungs every 6 (six) hours as needed for wheezing or shortness of breath.  . Ascorbic Acid (VITAMIN C) 100 MG tablet Take 100 mg by mouth daily.  Marland Kitchen aspirin EC 81 MG tablet Take 81 mg by mouth daily.  . B Complex-C (B-COMPLEX WITH VITAMIN C) tablet Take 1 tablet by mouth daily.  . budesonide-formoterol (SYMBICORT) 80-4.5 MCG/ACT  inhaler Inhale 2 puffs into the lungs 2 (two) times daily.  . Calcium Carb-Cholecalciferol 600-100 MG-UNIT CAPS Take by mouth.  Marland Kitchen FLUoxetine (PROZAC) 10 MG capsule Take 10 mg by mouth daily.  Marland Kitchen ibuprofen (ADVIL) 200 MG tablet Take 200 mg by mouth every 6 (six) hours as needed.  . loratadine (CLARITIN) 10 MG tablet Take 10 mg by mouth daily.  . Multiple Vitamin (MULTIVITAMIN) capsule Take 1 capsule by mouth daily.  . pseudoephedrine-acetaminophen (TYLENOL SINUS) 30-500 MG TABS tablet Take 1 tablet by mouth every 4 (four) hours as needed.  . rosuvastatin (CRESTOR) 10 MG tablet TAKE 1 TABLET BY MOUTH EVERY DAY    Cardiac Studies:   Echocardiogram 04/08/2019: Normal LV systolic function with EF 56%. Left ventricle cavity is normal in size. Mild concentric hypertrophy of the left ventricle. Normal global wall motion. Normal diastolic filling pattern. Calculated EF 56%. Left atrial cavity is normal in size. Thin interatrial septum without definite 2D or color Doppler evidence of interatrial shunt. Aortic valve is likely trileaflet with mild aortic valve leaflet calcification. Mild aortic stenosis. Aortic valve mean gradient of 8 mmHg, Vmax of 2.1  m/s. Calculated aortic valve area by continuity equation is 1.3 cm. No aortic valve regurgitation noted. Mild (Grade I) mitral regurgitation. Moderate tricuspid regurgitation. Estimated pulmonary artery systolic pressure 28  mmHg.  Carotid artery duplex  04/08/2019: Stenosis in the right internal carotid artery (50-69%). Stenosis in the left internal carotid artery (1-15%). Mild stenosis in the left external carotid artery (<50%). Bilateral heterogeneous plaque noted. Antegrade right vertebral artery flow. Antegrade left vertebral artery flow. Follow up in six months is appropriate if clinically indicated.  Assessment:     ICD-10-CM   1. Mixed hyperlipidemia  E78.2 Lipid Profile    Comprehensive Metabolic Panel (CMET)  2. Aortic stenosis, mild  I35.0   3. Asymptomatic bilateral carotid artery stenosis  I65.23   4. Primary hypertension  I10      EKG 03/14/2019: Normal sinus rhythm at 69 bpm, normal axis, IRBBB. Low voltage complexes.   Recommendations:   She is tolerating Crestor well, has not had repeat labs.  Will order CMP and lipid panel for surveillance.  Continue with statin therapy along with aspirin therapy in view of carotid stenosis.  She is asymptomatic in regard to aortic stenosis that is mild by recent echocardiogram.  She will need continued surveillance in 1 to 2 years.  Her blood pressure is noted to be markedly elevated today, has also been elevated in the past.  Will start amlodipine 5 mg daily.  Previously her potassium level have been slightly elevated, therefore did not start ACE inhibitor or arm therapy at this point.  Encouraged her to follow low-sodium diet to help with her blood pressure.  I will see her back in 8 weeks for follow-up.   Miquel Dunn, MSN, APRN, FNP-C [facility]. [place] Office: [phone] Fax: [phone]

## 2019-09-20 ENCOUNTER — Ambulatory Visit: Payer: Medicare HMO | Admitting: Cardiology

## 2019-09-27 ENCOUNTER — Other Ambulatory Visit: Payer: Self-pay

## 2019-09-27 MED ORDER — AMLODIPINE BESYLATE 5 MG PO TABS: 5.0000 mg | ORAL_TABLET | Freq: Every day | ORAL | 2 refills | Status: DC

## 2019-12-17 ENCOUNTER — Ambulatory Visit: Payer: Medicare Other | Attending: Internal Medicine

## 2019-12-17 DIAGNOSIS — Z23 Encounter for immunization: Secondary | ICD-10-CM

## 2019-12-17 NOTE — Progress Notes (Signed)
   Covid-19 Vaccination Clinic  Name:  Sandra Anthony    MRN: [id number] DOB: [birth year]  12/17/2019  Sandra Anthony was observed post Covid-19 immunization for 15 minutes without incidence. She was provided with Vaccine Information Sheet and instruction to access the V-Safe system.   Sandra Anthony was instructed to call 911 with any severe reactions post vaccine: Marland Kitchen Difficulty breathing  . Swelling of your face and throat  . A fast heartbeat  . A bad rash all over your body  . Dizziness and weakness    Immunizations Administered    Name Date Dose VIS Date Route   Pfizer COVID-19 Vaccine 12/17/2019 10:52 AM 0.3 mL 10/07/2019 Intramuscular   Manufacturer: [employer], [employer]   Lot: [id number]   NDC: [address]-5

## 2019-12-30 ENCOUNTER — Other Ambulatory Visit: Payer: Self-pay

## 2019-12-30 MED ORDER — ROSUVASTATIN CALCIUM 10 MG PO TABS: 10.0000 mg | ORAL_TABLET | Freq: Every day | ORAL | 3 refills | Status: DC

## 2020-01-09 ENCOUNTER — Ambulatory Visit: Payer: Medicare Other | Attending: Internal Medicine

## 2020-01-09 DIAGNOSIS — Z23 Encounter for immunization: Secondary | ICD-10-CM

## 2020-01-09 NOTE — Progress Notes (Signed)
   Covid-19 Vaccination Clinic  Name:  JADIS PITTER    MRN: [id number] DOB: 1956/02/04  01/09/2020  Ms. Herbison was observed post Covid-19 immunization for 15 minutes without incident. She was provided with Vaccine Information Sheet and instruction to access the V-Safe system.   Ms. Zara was instructed to call 911 with any severe reactions post vaccine: Marland Kitchen Difficulty breathing  . Swelling of face and throat  . A fast heartbeat  . A bad rash all over body  . Dizziness and weakness   Immunizations Administered    Name Date Dose VIS Date Route   Pfizer COVID-19 Vaccine 01/09/2020  9:15 AM 0.3 mL 10/07/2019 Intramuscular   Manufacturer: [employer], [employer]   Lot: [id number]   NDC: [address]-2

## 2020-01-18 ENCOUNTER — Other Ambulatory Visit: Payer: Self-pay | Admitting: Cardiology

## 2020-02-14 ENCOUNTER — Other Ambulatory Visit: Payer: Self-pay | Admitting: Cardiology

## 2020-02-20 ENCOUNTER — Other Ambulatory Visit: Payer: Self-pay

## 2020-02-20 MED ORDER — ROSUVASTATIN CALCIUM 10 MG PO TABS: 10.0000 mg | ORAL_TABLET | Freq: Every day | ORAL | 3 refills | Status: DC

## 2020-02-21 ENCOUNTER — Other Ambulatory Visit: Payer: Self-pay

## 2020-05-25 ENCOUNTER — Other Ambulatory Visit: Payer: Self-pay | Admitting: Cardiology

## 2020-06-06 ENCOUNTER — Other Ambulatory Visit: Payer: Self-pay | Admitting: Cardiology

## 2020-08-28 ENCOUNTER — Other Ambulatory Visit: Payer: Self-pay | Admitting: Cardiology

## 2020-10-01 ENCOUNTER — Other Ambulatory Visit: Payer: Self-pay | Admitting: Cardiology

## 2020-11-20 ENCOUNTER — Other Ambulatory Visit: Payer: Self-pay

## 2021-01-07 ENCOUNTER — Other Ambulatory Visit: Payer: Self-pay | Admitting: Cardiology

## 2021-02-05 ENCOUNTER — Other Ambulatory Visit: Payer: Self-pay

## 2021-02-05 MED ORDER — ROSUVASTATIN CALCIUM 10 MG PO TABS: 10.0000 mg | ORAL_TABLET | Freq: Every day | ORAL | 0 refills | Status: DC

## 2021-03-04 ENCOUNTER — Other Ambulatory Visit: Payer: Self-pay | Admitting: Cardiology

## 2021-03-27 ENCOUNTER — Encounter: Payer: Self-pay | Admitting: Student

## 2021-03-27 ENCOUNTER — Other Ambulatory Visit: Payer: Self-pay

## 2021-03-27 ENCOUNTER — Ambulatory Visit: Payer: Medicare Other | Admitting: Student

## 2021-03-27 VITALS — BP 138/73 | HR 80 | Temp 98.0°F | Resp 17 | Ht 61.0 in | Wt 125.0 lb

## 2021-03-27 DIAGNOSIS — I350 Nonrheumatic aortic (valve) stenosis: Secondary | ICD-10-CM

## 2021-03-27 DIAGNOSIS — I10 Essential (primary) hypertension: Secondary | ICD-10-CM

## 2021-03-27 DIAGNOSIS — I6523 Occlusion and stenosis of bilateral carotid arteries: Secondary | ICD-10-CM

## 2021-03-27 MED ORDER — AMLODIPINE BESYLATE 5 MG PO TABS: ORAL_TABLET | ORAL | 3 refills | Status: DC

## 2021-03-27 MED ORDER — ROSUVASTATIN CALCIUM 20 MG PO TABS: 20.0000 mg | ORAL_TABLET | Freq: Every day | ORAL | 3 refills | Status: DC

## 2021-03-27 NOTE — Progress Notes (Signed)
Primary Physician:  Vicenta Aly, FNP   Patient ID: [name], female    DOB: 11-03-1955, 65 y.o.   MRN: [id number]  Subjective:    Chief Complaint  Patient presents with  . Follow-up  . Leg Swelling  . MEDICATION MANAGMENT    HPI: HEATHER MCKENDREE  is a 65 y.o. female former smoker, quit in 2018, COPD, hypertension, hyperlipidemia, bilateral carotid artery stenosis.  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    Patient was last seen in our office 07/26/2019 by Binnie Kand, NP at which time she was advised to follow-up in 8 weeks, unfortunately patient was lost to follow-up until now.  She now presents with complaints of bilateral lower leg swelling over the last 3 days.  Patient states that she has never had leg swelling before.  However she took 3 days off of work and relaxed, and she noticed swelling bilaterally.  She then returned to work yesterday and swelling resolved.  Patient denies chest pain, palpitations, dizziness, dyspnea, syncope, near syncope.   Notably patient has previously been on amlodipine for hypertension management.  She has been out of amlodipine for the last 2 weeks, therefore was not taking it when bilateral lower leg edema occurred.  Patient is overdue for repeat echocardiogram and carotid artery ultrasound.  Patient's estimated 10-year risk of ASCVD is 9.1%.  Past Medical History:  Diagnosis Date  . Abnormal chest x-ray   . Abnormal TSH   . COPD (chronic obstructive pulmonary disease) ([place])   . GAD (generalized anxiety disorder)   . Gallstones   . Hyperlipidemia   . Tailor's bunion of right foot     Past Surgical History:  Procedure Laterality Date  . ABDOMINAL HYSTERECTOMY    . SINUS SURGERY WITH INSTATRAK    . TONSILLECTOMY     Family History  Problem Relation Age of Onset  . Cancer Mother   . COPD Father   . Heart attack Paternal Grandfather   . Multiple sclerosis Sister   . Breast cancer  Sister    Social History   Tobacco Use  . Smoking status: Former Smoker    Packs/day: 1.00    Years: 40.00    Pack years: 40.00    Types: Cigarettes    Quit date: 08/27/2017    Years since quitting: 3.5  . Smokeless tobacco: Never Used  Substance Use Topics  . Alcohol use: No   Marital Status: Single   ROS   Review of Systems  Constitutional: Negative for malaise/fatigue and weight gain.  Cardiovascular: Positive for leg swelling (resolved yesterday). Negative for chest pain, claudication, near-syncope, orthopnea, palpitations, paroxysmal nocturnal dyspnea and syncope.  Respiratory: Negative for shortness of breath.   Hematologic/Lymphatic: Does not bruise/bleed easily.  Gastrointestinal: Negative for melena.  Neurological: Negative for dizziness and weakness.      Objective:  Blood pressure 138/73, pulse 80, temperature 98 F (36.7 C), temperature source Temporal, resp. rate 17, height 5' 1"  (1.549 m), weight 125 lb (56.7 kg), SpO2 98 %. Body mass index is 23.62 kg/m.    Physical Exam Vitals reviewed.  Constitutional:      Appearance: She is well-developed.  HENT:     Head: Normocephalic and atraumatic.  Cardiovascular:     Rate and Rhythm: Normal rate and regular rhythm.     Pulses: Intact distal pulses.          Carotid pulses are on the right side  with bruit and on the left side with bruit.      Femoral pulses are 1+ on the right side and 1+ on the left side.      Popliteal pulses are 1+ on the right side and 1+ on the left side.       Dorsalis pedis pulses are 1+ on the right side and 1+ on the left side.       Posterior tibial pulses are 1+ on the right side and 1+ on the left side.     Heart sounds: Murmur heard.   Harsh early systolic murmur is present with a grade of 2/6 at the upper right sternal border radiating to the neck.   Pulmonary:     Effort: Pulmonary effort is normal. No accessory muscle usage or respiratory distress.     Breath sounds: Normal  breath sounds.  Abdominal:     General: Bowel sounds are normal.     Palpations: Abdomen is soft.  Musculoskeletal:        General: Normal range of motion.     Cervical back: Normal range of motion.  Skin:    General: Skin is warm and dry.  Neurological:     Mental Status: She is alert and oriented to person, place, and time.    Laboratory examination:   No flowsheet data found. No flowsheet data found. Lipid Panel  No results found for: CHOL, TRIG, HDL, CHOLHDL, VLDL, LDLCALC, LDLDIRECT HEMOGLOBIN A1C No results found for: HGBA1C, MPG TSH No results for input(s): TSH in the last 8760 hours.  External Labs:  11/30/2020: Total cholesterol 218, triglycerides 50, HDL 102, LDL 107 Glucose 84, BUN 15, creatinine 0.75, GFR 83, sodium 130, potassium 4.5, alk phos 61, AST 35, ALT 23  08/26/2019: TSH 0.597  03/01/2019:  Creatinine 0.65, eGFR 94, K 5.3, CMP otherwise normal.  Cholesterol 231, triglycerides 54, HDL 98, LDL 122.   Allergies   Allergies  Allergen Reactions  . Codeine Other (See Comments)    fainting  . Other Other (See Comments)    All narcotics     Medications Prior to Visit:   Outpatient Medications Prior to Visit  Medication Sig Dispense Refill  . Acetaminophen 500 MG coapsule Take by mouth every 4 (four) hours as needed.     Marland Kitchen albuterol (PROVENTIL HFA;VENTOLIN HFA) 108 (90 Base) MCG/ACT inhaler Inhale into the lungs every 6 (six) hours as needed for wheezing or shortness of breath.    . Ascorbic Acid (VITAMIN C) 100 MG tablet Take 100 mg by mouth daily.    Marland Kitchen aspirin EC 81 MG tablet Take 81 mg by mouth daily.    . B Complex-C (B-COMPLEX WITH VITAMIN C) tablet Take 1 tablet by mouth daily.    . budesonide-formoterol (SYMBICORT) 80-4.5 MCG/ACT inhaler Inhale 2 puffs into the lungs 2 (two) times daily.    . Calcium Carb-Cholecalciferol 600-100 MG-UNIT CAPS Take by mouth.    Marland Kitchen FLUoxetine (PROZAC) 10 MG capsule TAKE 1 CAPSULE BY MOUTH EVERY DAY -NEED OFFICE  VISIT FOR ADDITIONAL REFILLS 30 capsule 1  . ibuprofen (ADVIL) 200 MG tablet Take 200 mg by mouth every 6 (six) hours as needed.    . loratadine (CLARITIN) 10 MG tablet Take 10 mg by mouth daily.    . Multiple Vitamin (MULTIVITAMIN) capsule Take 1 capsule by mouth daily.    . pseudoephedrine-acetaminophen (TYLENOL SINUS) 30-500 MG TABS tablet Take 1 tablet by mouth every 4 (four) hours as needed.    Marland Kitchen  amLODipine (NORVASC) 5 MG tablet TAKE 1 TABLET BY MOUTH EVERY DAY - NEED OFFICE VISIT FOR ADDITIONAL REFILLS 30 tablet 1  . rosuvastatin (CRESTOR) 10 MG tablet Take 1 tablet (10 mg total) by mouth daily. 30 tablet 0   No facility-administered medications prior to visit.   Final Medications at End of Visit    Current Meds  Medication Sig  . Acetaminophen 500 MG coapsule Take by mouth every 4 (four) hours as needed.   Marland Kitchen albuterol (PROVENTIL HFA;VENTOLIN HFA) 108 (90 Base) MCG/ACT inhaler Inhale into the lungs every 6 (six) hours as needed for wheezing or shortness of breath.  . Ascorbic Acid (VITAMIN C) 100 MG tablet Take 100 mg by mouth daily.  Marland Kitchen aspirin EC 81 MG tablet Take 81 mg by mouth daily.  . B Complex-C (B-COMPLEX WITH VITAMIN C) tablet Take 1 tablet by mouth daily.  . budesonide-formoterol (SYMBICORT) 80-4.5 MCG/ACT inhaler Inhale 2 puffs into the lungs 2 (two) times daily.  . Calcium Carb-Cholecalciferol 600-100 MG-UNIT CAPS Take by mouth.  Marland Kitchen FLUoxetine (PROZAC) 10 MG capsule TAKE 1 CAPSULE BY MOUTH EVERY DAY -NEED OFFICE VISIT FOR ADDITIONAL REFILLS  . ibuprofen (ADVIL) 200 MG tablet Take 200 mg by mouth every 6 (six) hours as needed.  . loratadine (CLARITIN) 10 MG tablet Take 10 mg by mouth daily.  . Multiple Vitamin (MULTIVITAMIN) capsule Take 1 capsule by mouth daily.  . pseudoephedrine-acetaminophen (TYLENOL SINUS) 30-500 MG TABS tablet Take 1 tablet by mouth every 4 (four) hours as needed.  . [DISCONTINUED] amLODipine (NORVASC) 5 MG tablet TAKE 1 TABLET BY MOUTH EVERY DAY - NEED  OFFICE VISIT FOR ADDITIONAL REFILLS  . [DISCONTINUED] rosuvastatin (CRESTOR) 10 MG tablet Take 1 tablet (10 mg total) by mouth daily.   Radiology   No results found.  Cardiac Studies:   Echocardiogram 04/08/2019: Normal LV systolic function with EF 56%. Left ventricle cavity is normal in size. Mild concentric hypertrophy of the left ventricle. Normal global wall motion. Normal diastolic filling pattern. Calculated EF 56%. Left atrial cavity is normal in size. Thin interatrial septum without definite 2D or color Doppler evidence of interatrial shunt. Aortic valve is likely trileaflet with mild aortic valve leaflet calcification. Mild aortic stenosis. Aortic valve mean gradient of 8 mmHg, Vmax of 2.1  m/s. Calculated aortic valve area by continuity equation is 1.3 cm. No aortic valve regurgitation noted. Mild (Grade I) mitral regurgitation. Moderate tricuspid regurgitation. Estimated pulmonary artery systolic pressure 28 mmHg.  Carotid artery duplex  04/08/2019: Stenosis in the right internal carotid artery (50-69%). Stenosis in the left internal carotid artery (1-15%). Mild stenosis in the left external carotid artery (<50%). Bilateral heterogeneous plaque noted. Antegrade right vertebral artery flow. Antegrade left vertebral artery flow. Follow up in six months is appropriate if clinically indicated.   EKG:   EKG 03/27/2021: Sinus rhythm at a rate of 66 bpm.  Left atrial enlargement.  Normal axis.  Incomplete right bundle branch block.  Low voltage complexes.  No evidence of ischemia or underlying injury pattern.  Compared o EKG 03/14/2019, no significant change.  EKG 03/14/2019: Normal sinus rhythm at 69 bpm, normal axis, IRBBB. Low voltage complexes.   Assessment:     ICD-10-CM   1. Primary hypertension  I10 EKG 12-Lead  2. Asymptomatic bilateral carotid artery stenosis  I65.23 PCV CAROTID DUPLEX (BILATERAL)  3. Aortic stenosis, mild  I35.0 PCV ECHOCARDIOGRAM COMPLETE   Meds  ordered this encounter  Medications  . amLODipine (NORVASC) 5 MG tablet  Sig: TAKE 1 TABLET BY MOUTH EVERY DAY - NEED OFFICE VISIT FOR ADDITIONAL REFILLS    Dispense:  90 tablet    Refill:  3  . rosuvastatin (CRESTOR) 20 MG tablet    Sig: Take 1 tablet (20 mg total) by mouth daily.    Dispense:  90 tablet    Refill:  3    Patient needs appointment for further refills   Medications Discontinued During This Encounter  Medication Reason  . amLODipine (NORVASC) 5 MG tablet Reorder  . rosuvastatin (CRESTOR) 10 MG tablet    Recommendations:   BLU MCGLAUN  is a 65 y.o. female former smoker, quit in 2018, COPD, hypertension, hyperlipidemia, bilateral carotid artery stenosis.  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  Suspect patient's leg swelling was related to inactivity, and has completely resolved.  There is no edema in either leg on exam today.  However on exam patient's systolic murmur as well as bilateral carotid artery bruits both more prominent on exam today compared to prior exam.  We will repeat echocardiogram as well as carotid artery duplex at this time.  Patient's blood pressure is mildly elevated in the office at this time, will resume amlodipine.  Encourage patient to continue to monitor blood pressure on a regular basis and notify our office if it remains elevated.  Reviewed external labs, in view of LDL of 107 and 10-year estimated risk of ASCVD at 9.1%, recommend increase Crestor from 10 mg to 20 mg daily.  We will plan to repeat lipid profile testing in 3 months.  Follow up in 8 weeks, sooner if needed, for hypertension, hyperlipidemia, results of cardiac testing.   Alethia Berthold, PA-C 03/27/2021, 12:40 PM Office: [phone]

## 2021-05-07 ENCOUNTER — Ambulatory Visit: Payer: Medicare Other

## 2021-05-07 ENCOUNTER — Other Ambulatory Visit: Payer: Self-pay

## 2021-05-07 DIAGNOSIS — I350 Nonrheumatic aortic (valve) stenosis: Secondary | ICD-10-CM

## 2021-05-07 DIAGNOSIS — I6523 Occlusion and stenosis of bilateral carotid arteries: Secondary | ICD-10-CM

## 2021-05-16 NOTE — Progress Notes (Signed)
Primary Physician:  Vicenta Aly, FNP   Patient ID: Sandra Anthony, female    DOB: December 31, 1955, 65 y.o.   MRN: [id number]  Subjective:    Chief Complaint  Patient presents with   Hypertension   Results    HPI: Sandra Anthony  is a 65 y.o. female former smoker, quit in 2018, COPD, hypertension, hyperlipidemia, bilateral carotid artery stenosis.  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  Patient was seen in our office in 2020, but was unfortunately lost to follow-up until recent office visit 03/27/2021 when she presented with complaints of bilateral lower leg swelling.  At last visit ordered repeat echocardiogram and carotid artery duplex as patient's systolic murmur and carotid bruits are more prominent on physical exam than previously noted.  Echocardiogram revealed LVEF of 57% with mild mitral regurgitation and mild to moderate tricuspid regurgitation, no significant change compared to previous study in 2020.  Carotid artery duplex also stable compared to 2020, we will plan to repeat surveillance in 6 months.  Also at last visit resumed amlodipine and increased Crestor from 10 mg to 20 mg daily.  Patient is feeling well without specific complaints. She has had no recurrence or leg swelling. Patient denies chest pain, palpitations, dizziness, dyspnea, syncope, near syncope.  Blood pressure is now well controlled.  Past Medical History:  Diagnosis Date   Abnormal chest x-ray    Abnormal TSH    COPD (chronic obstructive pulmonary disease) ([facility])    GAD (generalized anxiety disorder)    Gallstones    Hyperlipidemia    Tailor's bunion of right foot     Past Surgical History:  Procedure Laterality Date   ABDOMINAL HYSTERECTOMY     SINUS SURGERY WITH INSTATRAK     TONSILLECTOMY     Family History  Problem Relation Age of Onset   Cancer Mother    COPD Father    Heart attack Paternal Grandfather    Multiple sclerosis Sister    Breast  cancer Sister    Social History   Tobacco Use   Smoking status: Former    Packs/day: 1.00    Years: 40.00    Pack years: 40.00    Types: Cigarettes    Quit date: 08/27/2017    Years since quitting: 3.7   Smokeless tobacco: Never  Substance Use Topics   Alcohol use: No   Marital Status: Single   ROS   Review of Systems  Constitutional: Negative for malaise/fatigue and weight gain.  Cardiovascular:  Negative for chest pain, claudication, leg swelling, near-syncope, orthopnea, palpitations, paroxysmal nocturnal dyspnea and syncope.  Respiratory:  Negative for shortness of breath.   Hematologic/Lymphatic: Does not bruise/bleed easily.  Gastrointestinal:  Negative for melena.  Neurological:  Negative for dizziness and weakness.     Objective:  Blood pressure (!) 126/59, pulse 68, temperature 98 F (36.7 C), height 5' 1"  (1.549 m), weight 123 lb (55.8 kg), SpO2 99 %. Body mass index is 23.24 kg/m.    Physical Exam Vitals reviewed.  Constitutional:      Appearance: She is well-developed.  Cardiovascular:     Rate and Rhythm: Normal rate and regular rhythm.     Pulses: Intact distal pulses.          Carotid pulses are  on the right side with bruit and  on the left side with bruit.      Femoral pulses are 1+ on the right side and 1+  on the left side.      Popliteal pulses are 1+ on the right side and 1+ on the left side.       Dorsalis pedis pulses are 1+ on the right side and 1+ on the left side.       Posterior tibial pulses are 1+ on the right side and 1+ on the left side.     Heart sounds: Murmur heard.  Harsh early systolic murmur is present with a grade of 2/6 at the upper right sternal border radiating to the neck.  Pulmonary:     Effort: Pulmonary effort is normal. No accessory muscle usage or respiratory distress.     Breath sounds: Normal breath sounds.  Musculoskeletal:        General: Normal range of motion.     Cervical back: Normal range of motion.     Right  lower leg: No edema.     Left lower leg: No edema.  Neurological:     Mental Status: She is alert.   Laboratory examination:   No flowsheet data found. No flowsheet data found. Lipid Panel  No results found for: CHOL, TRIG, HDL, CHOLHDL, VLDL, LDLCALC, LDLDIRECT HEMOGLOBIN A1C No results found for: HGBA1C, MPG TSH No results for input(s): TSH in the last 8760 hours.  External Labs:  11/30/2020: Total cholesterol 218, triglycerides 50, HDL 102, LDL 107 Glucose 84, BUN 15, creatinine 0.75, GFR 83, sodium 130, potassium 4.5, alk phos 61, AST 35, ALT 23  08/26/2019: TSH 0.597  03/01/2019:  Creatinine 0.65, eGFR 94, K 5.3, CMP otherwise normal.  Cholesterol 231, triglycerides 54, HDL 98, LDL 122.   Allergies   Allergies  Allergen Reactions   Codeine Other (See Comments)    fainting   Other Other (See Comments)    All narcotics     Medications Prior to Visit:   Outpatient Medications Prior to Visit  Medication Sig Dispense Refill   Acetaminophen 500 MG coapsule Take by mouth every 4 (four) hours as needed.      albuterol (PROVENTIL HFA;VENTOLIN HFA) 108 (90 Base) MCG/ACT inhaler Inhale into the lungs every 6 (six) hours as needed for wheezing or shortness of breath.     amLODipine (NORVASC) 5 MG tablet TAKE 1 TABLET BY MOUTH EVERY DAY - NEED OFFICE VISIT FOR ADDITIONAL REFILLS 90 tablet 3   Ascorbic Acid (VITAMIN C) 100 MG tablet Take 100 mg by mouth daily.     B Complex-C (B-COMPLEX WITH VITAMIN C) tablet Take 1 tablet by mouth daily.     budesonide-formoterol (SYMBICORT) 80-4.5 MCG/ACT inhaler Inhale 2 puffs into the lungs 2 (two) times daily.     Calcium Carb-Cholecalciferol 600-100 MG-UNIT CAPS Take by mouth.     FLUoxetine (PROZAC) 10 MG capsule TAKE 1 CAPSULE BY MOUTH EVERY DAY -NEED OFFICE VISIT FOR ADDITIONAL REFILLS 30 capsule 1   ibuprofen (ADVIL) 200 MG tablet Take 200 mg by mouth every 6 (six) hours as needed.     loratadine (CLARITIN) 10 MG tablet Take 10 mg by  mouth daily.     Multiple Vitamin (MULTIVITAMIN) capsule Take 1 capsule by mouth daily.     pseudoephedrine-acetaminophen (TYLENOL SINUS) 30-500 MG TABS tablet Take 1 tablet by mouth every 4 (four) hours as needed.     rosuvastatin (CRESTOR) 20 MG tablet Take 1 tablet (20 mg total) by mouth daily. 90 tablet 3   aspirin EC 81 MG tablet Take 81 mg by mouth daily.     No facility-administered  medications prior to visit.   Final Medications at End of Visit    Current Meds  Medication Sig   Acetaminophen 500 MG coapsule Take by mouth every 4 (four) hours as needed.    albuterol (PROVENTIL HFA;VENTOLIN HFA) 108 (90 Base) MCG/ACT inhaler Inhale into the lungs every 6 (six) hours as needed for wheezing or shortness of breath.   amLODipine (NORVASC) 5 MG tablet TAKE 1 TABLET BY MOUTH EVERY DAY - NEED OFFICE VISIT FOR ADDITIONAL REFILLS   Ascorbic Acid (VITAMIN C) 100 MG tablet Take 100 mg by mouth daily.   B Complex-C (B-COMPLEX WITH VITAMIN C) tablet Take 1 tablet by mouth daily.   budesonide-formoterol (SYMBICORT) 80-4.5 MCG/ACT inhaler Inhale 2 puffs into the lungs 2 (two) times daily.   Calcium Carb-Cholecalciferol 600-100 MG-UNIT CAPS Take by mouth.   FLUoxetine (PROZAC) 10 MG capsule TAKE 1 CAPSULE BY MOUTH EVERY DAY -NEED OFFICE VISIT FOR ADDITIONAL REFILLS   ibuprofen (ADVIL) 200 MG tablet Take 200 mg by mouth every 6 (six) hours as needed.   loratadine (CLARITIN) 10 MG tablet Take 10 mg by mouth daily.   Multiple Vitamin (MULTIVITAMIN) capsule Take 1 capsule by mouth daily.   pseudoephedrine-acetaminophen (TYLENOL SINUS) 30-500 MG TABS tablet Take 1 tablet by mouth every 4 (four) hours as needed.   rosuvastatin (CRESTOR) 20 MG tablet Take 1 tablet (20 mg total) by mouth daily.   [DISCONTINUED] aspirin EC 81 MG tablet Take 81 mg by mouth daily.   Radiology   No results found.  Cardiac Studies:   Carotid artery duplex 27-May-2021:  Duplex suggests stenosis in the right internal carotid  artery (50-69%).  Duplex suggests stenosis in the right external carotid artery (<50%).  Duplex suggests stenosis in the left internal carotid artery (16-49%).  Duplex suggests stenosis in the left external carotid artery (<50%).  Antegrade right vertebral artery flow. Antegrade left vertebral artery  flow.  No significant change since 04/08/2019. Follow up in six months is  appropriate if clinically indicated.  Echocardiogram May 27, 2021:  Normal LV systolic function with EF 57%. Left ventricle cavity is normal  in size. Mild concentric hypertrophy of the left ventricle. Normal global  wall motion. Calculated EF 57%.  Left atrial cavity is mildly dilated. Thin interatrial septum without  definite 2D or color Doppler evidence of interatrial shunt.  Trileaflet aortic valve with mild calcification of the aortic valve  annulus and leaflets. Mild aortic valve stenosis. Vmax 2.3 m/sec, mean PG  10 mmHg, AVA 1.7 cm2 by continuity equation No regurgitation.  Mild (Grade I) mitral regurgitation.  Mild to moderate tricuspid regurgitation. Estimated pulmonary artery  systolic pressure 35 mmHg.  No major change compared to previous study in 2020.  EKG:   EKG 03/27/2021: Sinus rhythm at a rate of 66 bpm.  Left atrial enlargement.  Normal axis.  Incomplete right bundle branch block.  Low voltage complexes.  No evidence of ischemia or underlying injury pattern.  Compared o EKG 03/14/2019, no significant change.  EKG 03/14/2019: Normal sinus rhythm at 69 bpm, normal axis, IRBBB. Low voltage complexes.   Assessment:     ICD-10-CM   1. Primary hypertension  I10     2. Asymptomatic bilateral carotid artery stenosis  I65.23 Lipid Panel With LDL/HDL Ratio    Lipid Panel With LDL/HDL Ratio    3. Mild mitral regurgitation  I34.0     4. Mixed hyperlipidemia  E78.2 Lipid Panel With LDL/HDL Ratio    Lipid Panel With LDL/HDL Ratio  5. Aortic stenosis, mild  I35.0      No orders of the defined types  were placed in this encounter.  Medications Discontinued During This Encounter  Medication Reason   aspirin EC 81 MG tablet Error   Recommendations:   Sandra Anthony  is a 65 y.o. former smoker, quit in 2018, COPD, hypertension, hyperlipidemia, bilateral carotid artery stenosis.  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  Patient was seen in our office in 2020, but was unfortunately lost to follow-up until recent office visit 03/27/2021 when she presented with complaints of bilateral lower leg swelling.  At last visit ordered repeat echocardiogram and carotid artery duplex as patient's systolic murmur and carotid bruits are more prominent on physical exam than previously noted.  Echocardiogram revealed LVEF of 57% with mild mitral regurgitation and mild to moderate tricuspid regurgitation, no significant change compared to previous study in 2020.  Carotid artery duplex also stable compared to 2020, we will plan to repeat surveillance in 6 months.  Also at last visit resumed amlodipine and increased Crestor from 10 mg to 20 mg daily.  Reviewed and discussed with patient echocardiogram and carotid duplex results and answered patient's questions, details above.  Echocardiogram and carotid artery duplex were both stable.  Patient's blood pressure is now well controlled, will continue amlodipine.  We will repeat lipid profile testing in 1 month.  Patient is otherwise stable from cardiovascular standpoint.  Follow-up in 6 months, sooner if needed, for hypertension, carotid artery stenosis, and valvular disease.   Alethia Berthold, PA-C 05/17/2021, 12:23 PM Office: [phone]

## 2021-05-17 ENCOUNTER — Other Ambulatory Visit: Payer: Self-pay

## 2021-05-17 ENCOUNTER — Ambulatory Visit: Payer: Medicare Other | Admitting: Student

## 2021-05-17 ENCOUNTER — Encounter: Payer: Self-pay | Admitting: Student

## 2021-05-17 VITALS — BP 126/59 | HR 68 | Temp 98.0°F | Ht 61.0 in | Wt 123.0 lb

## 2021-05-17 DIAGNOSIS — I340 Nonrheumatic mitral (valve) insufficiency: Secondary | ICD-10-CM

## 2021-05-17 DIAGNOSIS — I6523 Occlusion and stenosis of bilateral carotid arteries: Secondary | ICD-10-CM

## 2021-05-17 DIAGNOSIS — I350 Nonrheumatic aortic (valve) stenosis: Secondary | ICD-10-CM

## 2021-05-17 DIAGNOSIS — E782 Mixed hyperlipidemia: Secondary | ICD-10-CM

## 2021-05-17 DIAGNOSIS — I10 Essential (primary) hypertension: Secondary | ICD-10-CM

## 2021-11-19 ENCOUNTER — Other Ambulatory Visit: Payer: Medicare Other

## 2021-11-25 ENCOUNTER — Ambulatory Visit: Payer: Medicare Other | Admitting: Student

## 2021-11-28 ENCOUNTER — Other Ambulatory Visit: Payer: Self-pay

## 2021-11-28 ENCOUNTER — Ambulatory Visit: Payer: Medicare Other

## 2021-11-28 DIAGNOSIS — I6523 Occlusion and stenosis of bilateral carotid arteries: Secondary | ICD-10-CM

## 2021-12-03 NOTE — Progress Notes (Signed)
Primary Physician:  Vicenta Aly, FNP   Patient ID: Sandra Anthony, female    DOB: 31-Oct-1955, 66 y.o.   MRN: [id number]  Subjective:    Chief Complaint  Patient presents with   Hypertension   Follow-up    6 month   carotid stenosis   valve dz    HPI: Sandra Anthony  is a 66 y.o. female former smoker, quit in 2018, COPD, hypertension, hyperlipidemia, bilateral carotid artery stenosis.  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    Patient presents for 31-monthfollow up. At last office visit no changes were made as she was stable from a cardiac standpoint.  Repeat carotid surveillance showed no significant change, we will plan to repeat surveillance scan in 6 months.  Unfortunately lipid profile testing ordered at last office visit has not been done, but she does have an appointment tomorrow with her PCP for regular follow-up and will be doing labs including lipid profile testing.  Have asked that these results be shared with our office for review.  Patient continues to feel well overall without specific complaints today and remains fairly active.  Past Medical History:  Diagnosis Date   Abnormal chest x-ray    Abnormal TSH    COPD (chronic obstructive pulmonary disease) (HCC)    GAD (generalized anxiety disorder)    Gallstones    Hyperlipidemia    Tailor's bunion of right foot     Past Surgical History:  Procedure Laterality Date   ABDOMINAL HYSTERECTOMY     SINUS SURGERY WITH INSTATRAK     TONSILLECTOMY     Family History  Problem Relation Age of Onset   Cancer Mother    COPD Father    Heart attack Paternal Grandfather    Multiple sclerosis Sister    Breast cancer Sister    Social History   Tobacco Use   Smoking status: Former    Packs/day: 1.00    Years: 45.00    Pack years: 45.00    Types: Cigarettes    Quit date: 08/27/2017    Years since quitting: 4.2   Smokeless tobacco: Never  Substance Use Topics    Alcohol use: No   Marital Status: Single   ROS   Review of Systems  Cardiovascular:  Negative for chest pain, claudication, leg swelling, near-syncope, orthopnea, palpitations, paroxysmal nocturnal dyspnea and syncope.  Respiratory:  Negative for shortness of breath.      Objective:  Blood pressure 123/61, pulse 75, temperature 98 F (36.7 C), temperature source Temporal, resp. rate 17, height _0  (1.549 m), weight 120 lb (54.4 kg), SpO2 98 %. Body mass index is 22.67 kg/m.    Physical Exam Vitals reviewed.  Constitutional:      Appearance: She is well-developed.  Cardiovascular:     Rate and Rhythm: Normal rate and regular rhythm.     Pulses: Intact distal pulses.          Carotid pulses are  on the right side with bruit and  on the left side with bruit.      Femoral pulses are 1+ on the right side and 1+ on the left side.      Popliteal pulses are 1+ on the right side and 1+ on the left side.       Dorsalis pedis pulses are 1+ on the right side and 1+ on the left side.       Posterior tibial pulses  are 1+ on the right side and 1+ on the left side.     Heart sounds: Murmur heard.  Harsh early systolic murmur is present with a grade of 2/6 at the upper right sternal border radiating to the neck.  Pulmonary:     Effort: Pulmonary effort is normal. No accessory muscle usage or respiratory distress.     Breath sounds: Normal breath sounds.  Musculoskeletal:     Right lower leg: No edema.     Left lower leg: No edema.  Neurological:     Mental Status: She is alert.   Laboratory examination:   No flowsheet data found. No flowsheet data found. Lipid Panel  No results found for: CHOL, TRIG, HDL, CHOLHDL, VLDL, LDLCALC, LDLDIRECT HEMOGLOBIN A1C No results found for: HGBA1C, MPG TSH No results for input(s): TSH in the last 8760 hours.  External Labs:  11/30/2020: Total cholesterol 218, triglycerides 50, HDL 102, LDL 107 Glucose 84, BUN 15, creatinine 0.75, GFR 83, sodium  130, potassium 4.5, alk phos 61, AST 35, ALT 23  08/26/2019: TSH 0.597  03/01/2019:  Creatinine 0.65, eGFR 94, K 5.3, CMP otherwise normal.  Cholesterol 231, triglycerides 54, HDL 98, LDL 122.   Allergies   Allergies  Allergen Reactions   Codeine Other (See Comments)    fainting   Other Other (See Comments)    All narcotics     Medications Prior to Visit:   Outpatient Medications Prior to Visit  Medication Sig Dispense Refill   Acetaminophen 500 MG coapsule Take by mouth every 4 (four) hours as needed.      albuterol (PROVENTIL HFA;VENTOLIN HFA) 108 (90 Base) MCG/ACT inhaler Inhale into the lungs every 6 (six) hours as needed for wheezing or shortness of breath.     amLODipine (NORVASC) 5 MG tablet TAKE 1 TABLET BY MOUTH EVERY DAY - NEED OFFICE VISIT FOR ADDITIONAL REFILLS 90 tablet 3   Ascorbic Acid (VITAMIN C) 100 MG tablet Take 100 mg by mouth daily.     B Complex-C (B-COMPLEX WITH VITAMIN C) tablet Take 1 tablet by mouth daily.     budesonide-formoterol (SYMBICORT) 80-4.5 MCG/ACT inhaler Inhale 2 puffs into the lungs 2 (two) times daily.     Calcium Carb-Cholecalciferol 600-100 MG-UNIT CAPS Take by mouth.     FLUoxetine (PROZAC) 10 MG capsule TAKE 1 CAPSULE BY MOUTH EVERY DAY -NEED OFFICE VISIT FOR ADDITIONAL REFILLS 30 capsule 1   ibuprofen (ADVIL) 200 MG tablet Take 200 mg by mouth every 6 (six) hours as needed.     loratadine (CLARITIN) 10 MG tablet Take 10 mg by mouth daily as needed.     Multiple Vitamin (MULTIVITAMIN) capsule Take 1 capsule by mouth daily.     pseudoephedrine-acetaminophen (TYLENOL SINUS) 30-500 MG TABS tablet Take 1 tablet by mouth every 4 (four) hours as needed.     rosuvastatin (CRESTOR) 20 MG tablet Take 1 tablet (20 mg total) by mouth daily. 90 tablet 3   No facility-administered medications prior to visit.   Final Medications at End of Visit    Current Meds  Medication Sig   Acetaminophen 500 MG coapsule Take by mouth every 4 (four) hours as  needed.    albuterol (PROVENTIL HFA;VENTOLIN HFA) 108 (90 Base) MCG/ACT inhaler Inhale into the lungs every 6 (six) hours as needed for wheezing or shortness of breath.   amLODipine (NORVASC) 5 MG tablet TAKE 1 TABLET BY MOUTH EVERY DAY - NEED OFFICE VISIT FOR ADDITIONAL REFILLS   Ascorbic Acid (  VITAMIN C) 100 MG tablet Take 100 mg by mouth daily.   B Complex-C (B-COMPLEX WITH VITAMIN C) tablet Take 1 tablet by mouth daily.   budesonide-formoterol (SYMBICORT) 80-4.5 MCG/ACT inhaler Inhale 2 puffs into the lungs 2 (two) times daily.   Calcium Carb-Cholecalciferol 600-100 MG-UNIT CAPS Take by mouth.   FLUoxetine (PROZAC) 10 MG capsule TAKE 1 CAPSULE BY MOUTH EVERY DAY -NEED OFFICE VISIT FOR ADDITIONAL REFILLS   ibuprofen (ADVIL) 200 MG tablet Take 200 mg by mouth every 6 (six) hours as needed.   loratadine (CLARITIN) 10 MG tablet Take 10 mg by mouth daily as needed.   Multiple Vitamin (MULTIVITAMIN) capsule Take 1 capsule by mouth daily.   pseudoephedrine-acetaminophen (TYLENOL SINUS) 30-500 MG TABS tablet Take 1 tablet by mouth every 4 (four) hours as needed.   rosuvastatin (CRESTOR) 20 MG tablet Take 1 tablet (20 mg total) by mouth daily.   Radiology   No results found.  Cardiac Studies:   Carotid artery duplex 11/30/21:  Duplex suggests stenosis in the right internal carotid artery (50-69%).  Duplex suggests stenosis in the left internal carotid artery (1-15%).  Duplex suggests stenosis in the left external carotid artery (<50%).  Antegrade right vertebral artery flow. Antegrade left vertebral artery flow.  No significant change since 05/07/2021. Follow up in six months is appropriate if clinically indicated.  Echocardiogram 05/07/2021:  Normal LV systolic function with EF 57%. Left ventricle cavity is normal  in size. Mild concentric hypertrophy of the left ventricle. Normal global  wall motion. Calculated EF 57%.  Left atrial cavity is mildly dilated. Thin interatrial septum without   definite 2D or color Doppler evidence of interatrial shunt.  Trileaflet aortic valve with mild calcification of the aortic valve  annulus and leaflets. Mild aortic valve stenosis. Vmax 2.3 m/sec, mean PG  10 mmHg, AVA 1.7 cm2 by continuity equation No regurgitation.  Mild (Grade I) mitral regurgitation.  Mild to moderate tricuspid regurgitation. Estimated pulmonary artery  systolic pressure 35 mmHg.  No major change compared to previous study in 2020.  EKG:  12/05/2021: Sinus rhythm at a rate of 62 bpm.  Normal axis.  Left atrial enlargement.  Incomplete right bundle branch block.  Nonspecific T wave abnormality.  Low voltage complexes.  No evidence of ischemia or underlying injury pattern.  Compared EKG 03/27/2021, no significant change.  EKG 03/14/2019: Normal sinus rhythm at 69 bpm, normal axis, IRBBB. Low voltage complexes.   Assessment:     ICD-10-CM   1. Primary hypertension  I10 EKG 12-Lead    2. Asymptomatic bilateral carotid artery stenosis  I65.23 EKG 12-Lead    PCV CAROTID DUPLEX (BILATERAL)    3. Mixed hyperlipidemia  E78.2      No orders of the defined types were placed in this encounter.   There are no discontinued medications.  Recommendations:   Sandra Anthony  is a 66 y.o. former smoker, quit in 2018, COPD, hypertension, hyperlipidemia, bilateral carotid artery stenosis.  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    Patient presents for 5-monthfollow up. At last office visit n changes were made as she was stable from a cardiac standpoint.  Repeat carotid surveillance showed no significant change, we will plan to repeat surveillance scan in 6 months.  Unfortunately lipid profile testing ordered at last office visit has not been done, however it will be done tomorrow with her PCP and patient will have results shared with our office.  Patient is doing well overall and remains asymptomatic.  Blood pressure is well controlled.   Reviewed and discussed results of carotid artery duplex, which remained stable.  We will plan for repeat surveillance in 6 months.  EKG and physical exam remained unchanged compared to previous.  Continue Crestor, amlodipine.   Follow up in 1 year, sooner if needed.    Alethia Berthold, PA-C 12/05/2021, 8:57 AM Office: [phone]

## 2021-12-05 ENCOUNTER — Other Ambulatory Visit: Payer: Self-pay

## 2021-12-05 ENCOUNTER — Encounter: Payer: Self-pay | Admitting: Student

## 2021-12-05 ENCOUNTER — Ambulatory Visit: Payer: Medicare Other | Admitting: Student

## 2021-12-05 VITALS — BP 123/61 | HR 75 | Temp 98.0°F | Resp 17 | Ht 61.0 in | Wt 120.0 lb

## 2021-12-05 DIAGNOSIS — E782 Mixed hyperlipidemia: Secondary | ICD-10-CM

## 2021-12-05 DIAGNOSIS — I6523 Occlusion and stenosis of bilateral carotid arteries: Secondary | ICD-10-CM

## 2021-12-05 DIAGNOSIS — I10 Essential (primary) hypertension: Secondary | ICD-10-CM

## 2021-12-26 ENCOUNTER — Other Ambulatory Visit: Payer: Self-pay | Admitting: Student

## 2022-06-06 ENCOUNTER — Other Ambulatory Visit: Payer: Medicare Other

## 2022-10-09 ENCOUNTER — Other Ambulatory Visit: Payer: Medicare Other

## 2022-11-13 ENCOUNTER — Other Ambulatory Visit: Payer: Medicare Other

## 2022-12-04 ENCOUNTER — Ambulatory Visit: Payer: Medicare Other | Admitting: Internal Medicine

## 2022-12-05 ENCOUNTER — Ambulatory Visit: Payer: Medicare Other | Admitting: Student

## 2022-12-07 NOTE — Progress Notes (Signed)
cancelled

## 2023-04-13 ENCOUNTER — Other Ambulatory Visit: Payer: Self-pay

## 2023-04-13 MED ORDER — AMLODIPINE BESYLATE 5 MG PO TABS: ORAL_TABLET | ORAL | 3 refills | Status: AC

## 2023-04-13 MED ORDER — ROSUVASTATIN CALCIUM 20 MG PO TABS: 20.0000 mg | ORAL_TABLET | Freq: Every day | ORAL | 3 refills | Status: AC

## 2023-06-29 ENCOUNTER — Encounter (HOSPITAL_BASED_OUTPATIENT_CLINIC_OR_DEPARTMENT_OTHER): Payer: Self-pay

## 2023-06-29 ENCOUNTER — Emergency Department (HOSPITAL_BASED_OUTPATIENT_CLINIC_OR_DEPARTMENT_OTHER)
Admission: EM | Admit: 2023-06-29 | Discharge: 2023-06-30 | Disposition: A | Discharge status: Home / Self Care | Payer: Medicare Other | Attending: Emergency Medicine | Admitting: Emergency Medicine

## 2023-06-29 ENCOUNTER — Other Ambulatory Visit: Payer: Self-pay

## 2023-06-29 DIAGNOSIS — Z87891 Personal history of nicotine dependence: Secondary | ICD-10-CM | POA: Insufficient documentation

## 2023-06-29 DIAGNOSIS — J449 Chronic obstructive pulmonary disease, unspecified: Secondary | ICD-10-CM | POA: Insufficient documentation

## 2023-06-29 DIAGNOSIS — M542 Cervicalgia: Secondary | ICD-10-CM | POA: Insufficient documentation

## 2023-06-29 NOTE — ED Notes (Signed)
This RN staffed pt with ED-PA Jari Favre- no orders given at this time

## 2023-06-29 NOTE — ED Triage Notes (Addendum)
Pt reports went to [facility] on Thursday for neck pain ,  and had [place] and was told gallbladder inflammation and stones. Pt here today with No abdominal pain. No n/v. Pt here today because continues to have neck pain.

## 2023-06-30 MED ORDER — METHOCARBAMOL 500 MG PO TABS: 500.0000 mg | ORAL_TABLET | Freq: Three times a day (TID) | ORAL | 0 refills | Status: AC | PRN

## 2023-06-30 MED ORDER — METHOCARBAMOL 500 MG PO TABS
1000.0000 mg | ORAL_TABLET | Freq: Once | ORAL | Status: AC
  Administered 2023-06-30: 1000 mg via ORAL
  Filled 2023-06-30: qty 2

## 2023-06-30 MED ORDER — LIDOCAINE 5 % EX PTCH: 1.0000 | MEDICATED_PATCH | CUTANEOUS | 0 refills | Status: AC

## 2023-06-30 MED ORDER — NAPROXEN 500 MG PO TABS: 500.0000 mg | ORAL_TABLET | Freq: Two times a day (BID) | ORAL | 0 refills | Status: DC

## 2023-06-30 MED ORDER — KETOROLAC TROMETHAMINE 15 MG/ML IJ SOLN
15.0000 mg | Freq: Once | INTRAMUSCULAR | Status: AC
  Administered 2023-06-30: 15 mg via INTRAMUSCULAR
  Filled 2023-06-30: qty 1

## 2023-06-30 NOTE — ED Provider Notes (Signed)
[facility]ERGENC[facility] Emergency Department Provider Note MRN:  [id number]  Arrival date & time: 06/30/23     Chief Complaint   Neck Pain   History of Present Illness   Sandra Anthony is a 67 y.o. year-old female with a history of COPD presenting to the ED with chief complaint of neck pain.  Pain to the right lateral neck, trapezius, pain worsened with movement of the right arm.  Neck feels stiff.  Pain persisting for 2 weeks.  No trauma.  No numbness or weakness to the arms or legs, no bowel or bladder dysfunction.  Review of Systems  A thorough review of systems was obtained and all systems are negative except as noted in the HPI and PMH.   Patient's Health History    Past Medical History:  Diagnosis Date   Abnormal chest x-ray    Abnormal TSH    COPD (chronic obstructive pulmonary disease) (HCC)    GAD (generalized anxiety disorder)    Gallstones    Hyperlipidemia    Tailor's bunion of right foot     Past Surgical History:  Procedure Laterality Date   ABDOMINAL HYSTERECTOMY     SINUS SURGERY WITH INSTATRAK     TONSILLECTOMY      Family History  Problem Relation Age of Onset   Cancer Mother    COPD Father    Heart attack Paternal Grandfather    Multiple sclerosis Sister    Breast cancer Sister     Social History   Socioeconomic History   Marital status: Single    Spouse name: Not on file   Number of children: 3   Years of education: Not on file   Highest education level: Not on file  Occupational History   Not on file  Tobacco Use   Smoking status: Former    Current packs/day: 0.00    Average packs/day: 1 pack/day for 45.0 years (45.0 ttl pk-yrs)    Types: Cigarettes    Start date: 08/27/1972    Quit date: 08/27/2017    Years since quitting: 5.8   Smokeless tobacco: Never  Vaping Use   Vaping status: Some Days   Substances: Flavoring  Substance and Sexual Activity   Alcohol use: No   Drug use: Never   Sexual activity: Not on file   Other Topics Concern   Not on file  Social History Narrative   Not on file   Social Determinants of Health   Financial Resource Strain: Low Risk  (06/10/2023)   Received from [facility]   Overall Financial Resource Strain (CARDIA)    Difficulty of Paying Living Expenses: Not very hard  Food Insecurity: No Food Insecurity (06/10/2023)   Received from [facility]   Hunger Vital Sign    Worried About Running Out of Food in the Last Year: Never true    Ran Out of Food in the Last Year: Never true  Transportation Needs: No Transportation Needs (06/10/2023)   Received from [facility] - Transportation    Lack of Transportation (Medical): No    Lack of Transportation (Non-Medical): No  Physical Activity: Sufficiently Active (06/10/2023)   Received from [facility]   Exercise Vital Sign    Days of Exercise per Week: 5 days    Minutes of Exercise per Session: 100 min  Stress: Patient Declined (06/10/2023)   Received from [facility] - Occupational Stress Questionnaire    Feeling of Stress :  Patient declined  Social Connections: Moderately Integrated (06/10/2023)   Received from [facility]   Social Network    How would you rate your social network (family, work, friends)?: Adequate participation with social networks  Intimate Partner Violence: Not At Risk (06/10/2023)   Received from [facility]   HITS    Over the last 12 months how often did your partner physically hurt you?: 1    Over the last 12 months how often did your partner insult you or talk down to you?: 1    Over the last 12 months how often did your partner threaten you with physical harm?: 1    Over the last 12 months how often did your partner scream or curse at you?: 1     Physical Exam   Vitals:   06/29/23 2228  BP: 130/65  Pulse: 81  Resp: 18  Temp: 97.7 F (36.5 C)  SpO2: 97%    CONSTITUTIONAL: Well-appearing, NAD NEURO/PSYCH:  Alert and  oriented x 3, no focal deficits EYES:  eyes equal and reactive ENT/NECK:  no LAD, no JVD CARDIO: Regular rate, well-perfused, normal S1 and S2 PULM:  CTAB no wheezing or rhonchi GI/GU:  non-distended, non-tender MSK/SPINE:  No gross deformities, no edema SKIN:  no rash, atraumatic   *Additional and/or pertinent findings included in MDM below  Diagnostic and Interventional Summary    EKG Interpretation Date/Time:    Ventricular Rate:    PR Interval:    QRS Duration:    QT Interval:    QTC Calculation:   R Axis:      Text Interpretation:         Labs Reviewed - No data to display  No orders to display    Medications  methocarbamol (ROBAXIN) tablet 1,000 mg (has no administration in time range)  ketorolac (TORADOL) 15 MG/ML injection 15 mg (has no administration in time range)     Procedures  /  Critical Care Procedures  ED Course and Medical Decision Making  Initial Impression and Ddx Patient has tenderness to the right trapezius, right lateral neck musculature.  Pain is elicited with movement of the arm.  No signs or symptoms of myelopathy, no evidence of infection on exam, no trauma to warrant imaging.  All seems consistent with a trapezius muscle strain or spasm.  Past medical/surgical history that increases complexity of ED encounter: None  Interpretation of Diagnostics I personally reviewed the gallbladder ultrasound recently and my interpretation is as follows: Cholelithiasis without any evidence of cholecystitis    Patient Reassessment and Ultimate Disposition/Management     Discharge  Patient management required discussion with the following services or consulting groups:  None  Complexity of Problems Addressed Acute illness or injury that poses threat of life of bodily function  Additional Data Reviewed and Analyzed Further history obtained from: Further history from spouse/family member  Additional Factors Impacting ED Encounter  Risk Prescriptions  Elmer Sow. Pilar Plate, MD [facility] Emergency Medicine [facility] [email or web address]  Final Clinical Impressions(s) / ED Diagnoses     ICD-10-CM   1. Neck pain  M54.2       ED Discharge Orders          Ordered    naproxen (NAPROSYN) 500 MG tablet  2 times daily,   Status:  Discontinued        06/30/23 0109    methocarbamol (ROBAXIN) 500 MG tablet  Every 8 hours PRN        06/30/23  0109    lidocaine (LIDODERM) 5 %  Every 24 hours        06/30/23 0109             Discharge Instructions Discussed with and Provided to Patient:    Discharge Instructions      You were evaluated in the Emergency Department and after careful evaluation, we did not find any emergent condition requiring admission or further testing in the hospital.  Your exam/testing today is overall reassuring.  Symptoms likely due to muscle strain or spasm.  Continue Tylenol or Motrin for pain.  Use the muscle relaxers and numbing patches as needed.  Please return to the Emergency Department if you experience any worsening of your condition.   Thank you for allowing [place] to be a part of your care.      Sabas Sous, MD 06/30/23 [phone]

## 2023-06-30 NOTE — Discharge Instructions (Addendum)
You were evaluated in the Emergency Department and after careful evaluation, we did not find any emergent condition requiring admission or further testing in the hospital.  Your exam/testing today is overall reassuring.  Symptoms likely due to muscle strain or spasm.  Continue Tylenol or Motrin for pain.  Use the muscle relaxers and numbing patches as needed.  Please return to the Emergency Department if you experience any worsening of your condition.   Thank you for allowing [place] to be a part of your care.
